# Patient Record
Sex: Male | Born: 1995
Health system: Southern US, Community
[De-identification: ages and names within clinical notes are randomized; demographics above are authoritative.]

## PROBLEM LIST (undated history)

## (undated) DIAGNOSIS — H509 Unspecified strabismus: Secondary | ICD-10-CM

## (undated) DIAGNOSIS — S43005D Unspecified dislocation of left shoulder joint, subsequent encounter: Secondary | ICD-10-CM

## (undated) DIAGNOSIS — F909 Attention-deficit hyperactivity disorder, unspecified type: Secondary | ICD-10-CM

## (undated) DIAGNOSIS — F952 Tourette's disorder: Secondary | ICD-10-CM

## (undated) DIAGNOSIS — K219 Gastro-esophageal reflux disease without esophagitis: Secondary | ICD-10-CM

## (undated) HISTORY — DX: Unspecified dislocation of left shoulder joint, subsequent encounter: S43.005D

## (undated) HISTORY — DX: Gastro-esophageal reflux disease without esophagitis: K21.9

## (undated) HISTORY — PX: EYE SURGERY: SHX253

## (undated) HISTORY — DX: Tourette's disorder: F95.2

## (undated) HISTORY — DX: Attention-deficit hyperactivity disorder, unspecified type: F90.9

## (undated) HISTORY — DX: Unspecified strabismus: H50.9

---
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
## 2014-01-01 LAB — RPR

## 2014-01-01 LAB — GC/CHLAMYDIA PROBE AMP, URINE
Chlamydia, Swab/Urine, PCR: NEGATIVE
GC Probe Amp, Urine: NEGATIVE

## 2014-01-01 LAB — HSV(HERPES SIMPLEX VRS) I + II AB-IGG
HSV 1 Glycoprotein G Ab, IgG: <0.1 IV
HSV 2 Glycoprotein G Ab, IgG: <0.1 IV

## 2014-01-01 LAB — HEPATITIS B SURFACE ANTIGEN: Hepatitis B Surface Ag: NEGATIVE

## 2014-01-01 LAB — HIV ANTIBODY (ROUTINE TESTING W REFLEX): HIV 1&2 Ab, 4th Generation: NONREACTIVE

## 2014-01-01 LAB — HEPATITIS C ANTIBODY: HCV Ab: NEGATIVE

## 2015-01-20 ENCOUNTER — Ambulatory Visit (INDEPENDENT_AMBULATORY_CARE_PROVIDER_SITE_OTHER): Payer: BLUE CROSS/BLUE SHIELD | Admitting: Physician Assistant

## 2015-01-20 VITALS — BP 125/82 | HR 64 | Temp 98.6°F | Resp 18 | Wt 233.2 lb

## 2015-01-20 DIAGNOSIS — Z23 Encounter for immunization: Secondary | ICD-10-CM | POA: Diagnosis not present

## 2015-01-20 DIAGNOSIS — S60011A Contusion of right thumb without damage to nail, initial encounter: Secondary | ICD-10-CM

## 2015-01-20 DIAGNOSIS — S6010XA Contusion of unspecified finger with damage to nail, initial encounter: Secondary | ICD-10-CM

## 2015-01-20 NOTE — Patient Instructions (Signed)
Wash daily with soap and water.

## 2015-01-20 NOTE — Progress Notes (Signed)
Subjective:   Patient ID: Caleb Kent, male     DOB: 01-03-1996, 19 y.o.    MRN: [id number]  PCP: Nelwyn Salisbury, MD  Chief Complaint  Patient presents with  . Hand Pain    Lt Thumb smashed in car door  x over 1 month ago    HPI  Presents for evaluation of LEFT thumb injury. He is accompanied by his mother (this is his aunt by marriage).  One month ago, the LEFT thumb was smashed in a car door. His mother urged him to get it evaluated, but he declined. Now the back edge of the nail is lifting up. The thumb is not tender, but he has pain when he tries to pull the nail off. No fever or chills. Tdap 2008.  Prior to Admission medications   Medication Sig Start Date End Date Taking? Authorizing Provider  albuterol (PROVENTIL HFA;VENTOLIN HFA) 108 (90 BASE) MCG/ACT inhaler Inhale 2 puffs into the lungs every 6 (six) hours as needed for wheezing.   Yes Historical Provider, MD  albuterol (PROVENTIL) (2.5 MG/3ML) 0.083% nebulizer solution USE 1 VIAL PER NEBULIZER EVERY 4 HOURS AS NEEDED FOR WHEEZING OR SHORTNESS OF BREATH   Yes Nelwyn Salisbury, MD  PROAIR HFA 108 (90 BASE) MCG/ACT inhaler INHALE 2 PUFFS INTO THE LUNGS EVERY 4 (FOUR) HOURS AS NEEDED FOR WHEEZING OR SHORTNESS OF BREATH.   Yes Nelwyn Salisbury, MD  LORazepam (ATIVAN) 1 MG tablet Take 1 mg by mouth every 6 (six) hours as needed for anxiety.    Historical Provider, MD     No Known Allergies   Patient Active Problem List   Diagnosis Date Noted  . VIRAL URI 07/14/2009  . ABDOMINAL PAIN, EPIGASTRIC 07/07/2009  . KNEE SPRAIN 05/25/2009  . TOURETTE'S DISORDER 03/05/2009  . VIRAL GASTROENTERITIS 04/23/2008  . ADHD 01/08/2008  . ALLERGIC RHINITIS 05/15/2007  . ASTHMA 12/26/2006     History reviewed. No pertinent family history.   Social History   Social History  . Marital Status: Single    Spouse Name: n/a  . Number of Children: 0  . Years of Education: 13   Occupational History  . catering     part-time     Social History Main Topics  . Smoking status: Current Every Day Smoker    Types: Cigarettes  . Smokeless tobacco: Never Used  . Alcohol Use: No  . Drug Use: No  . Sexual Activity: Not on file   Other Topics Concern  . Not on file   Social History Narrative   Lives with paternal aunt and uncle.   Negative history of passive tobacco smoke exposure   Care taker verifies today that the child's immunizations are up to date.         Review of Systems  Constitutional: Negative for fever, chills, diaphoresis and fatigue.  Respiratory: Negative for shortness of breath.   Musculoskeletal: Negative for myalgias, joint swelling and arthralgias.  Skin: Positive for wound. Negative for rash.  Neurological: Negative for dizziness.         Objective:  Physical Exam  Constitutional: He is oriented to person, place, and time. He appears well-developed and well-nourished. He is active and cooperative. No distress.  BP 125/82 mmHg  Pulse 64  Temp(Src) 98.6 F (37 C) (Oral)  Resp 18  Wt 233 lb 3.2 oz (105.779 kg)  SpO2 98%   Eyes: Conjunctivae are normal.  Pulmonary/Chest: Effort normal.  Musculoskeletal:  Left hand: He exhibits tenderness (when lifting the nail). He exhibits normal range of motion, no bony tenderness, normal capillary refill and no swelling. Normal sensation noted. Normal strength noted.  95% subungual hematoma. The proximal edge of the nail has lifted above the nail fold, and new nail is visible underneath. There is dried, crusted blood under the nail, and attachment to the nail bed is minimal, at the distal edge.  Neurological: He is alert and oriented to person, place, and time.  Psychiatric: He has a normal mood and affect. His speech is normal and behavior is normal.    PROCEDURE: Verbal Consent Obtained. Digital Block with 5 cc 2% Lidocaine plain, and then 2 additional cc to achieve block. Sterile Prep. Nail lifted and excised, en toto. Cleansed  and dressed.           Assessment & Plan:  1. Subungual hematoma of digit of hand, initial encounter Local wound care. Anticipatory guidance provided.  2. Need for influenza vaccination - Flu Vaccine QUAD 36+ mos IM   Fernande Bras, PA-C Physician Assistant-Certified [facility]

## 2015-05-06 ENCOUNTER — Other Ambulatory Visit: Payer: Self-pay | Admitting: Family Medicine

## 2015-06-15 ENCOUNTER — Emergency Department (HOSPITAL_COMMUNITY): Payer: BLUE CROSS/BLUE SHIELD

## 2015-06-15 ENCOUNTER — Encounter (HOSPITAL_COMMUNITY): Payer: Self-pay

## 2015-06-15 ENCOUNTER — Emergency Department (HOSPITAL_COMMUNITY)
Admission: EM | Admit: 2015-06-15 | Discharge: 2015-06-15 | Disposition: A | Discharge status: Home / Self Care | Payer: BLUE CROSS/BLUE SHIELD | Attending: Emergency Medicine | Admitting: Emergency Medicine

## 2015-06-15 DIAGNOSIS — S4992XA Unspecified injury of left shoulder and upper arm, initial encounter: Secondary | ICD-10-CM | POA: Diagnosis present

## 2015-06-15 DIAGNOSIS — S43035A Inferior dislocation of left humerus, initial encounter: Secondary | ICD-10-CM | POA: Diagnosis not present

## 2015-06-15 DIAGNOSIS — X58XXXA Exposure to other specified factors, initial encounter: Secondary | ICD-10-CM | POA: Diagnosis not present

## 2015-06-15 DIAGNOSIS — Y92310 Basketball court as the place of occurrence of the external cause: Secondary | ICD-10-CM | POA: Insufficient documentation

## 2015-06-15 DIAGNOSIS — J45909 Unspecified asthma, uncomplicated: Secondary | ICD-10-CM | POA: Insufficient documentation

## 2015-06-15 DIAGNOSIS — Z8669 Personal history of other diseases of the nervous system and sense organs: Secondary | ICD-10-CM | POA: Insufficient documentation

## 2015-06-15 DIAGNOSIS — IMO0001 Reserved for inherently not codable concepts without codable children: Secondary | ICD-10-CM

## 2015-06-15 DIAGNOSIS — Z8659 Personal history of other mental and behavioral disorders: Secondary | ICD-10-CM | POA: Diagnosis not present

## 2015-06-15 DIAGNOSIS — Y9367 Activity, basketball: Secondary | ICD-10-CM | POA: Diagnosis not present

## 2015-06-15 DIAGNOSIS — S43015A Anterior dislocation of left humerus, initial encounter: Secondary | ICD-10-CM | POA: Diagnosis not present

## 2015-06-15 DIAGNOSIS — Y998 Other external cause status: Secondary | ICD-10-CM | POA: Insufficient documentation

## 2015-06-15 DIAGNOSIS — F17210 Nicotine dependence, cigarettes, uncomplicated: Secondary | ICD-10-CM | POA: Diagnosis not present

## 2015-06-15 DIAGNOSIS — Z79899 Other long term (current) drug therapy: Secondary | ICD-10-CM | POA: Insufficient documentation

## 2015-06-15 DIAGNOSIS — S43005A Unspecified dislocation of left shoulder joint, initial encounter: Secondary | ICD-10-CM

## 2015-06-15 DIAGNOSIS — Z8719 Personal history of other diseases of the digestive system: Secondary | ICD-10-CM | POA: Insufficient documentation

## 2015-06-15 MED ORDER — HYDROMORPHONE HCL 1 MG/ML IJ SOLN
1.0000 mg | Freq: Once | INTRAMUSCULAR | Status: AC
  Administered 2015-06-15: 1 mg via INTRAVENOUS
  Filled 2015-06-15: qty 1

## 2015-06-15 MED ORDER — PROPOFOL 10 MG/ML IV BOLUS
INTRAVENOUS | Status: AC | PRN
  Administered 2015-06-15: 50 mg via INTRAVENOUS

## 2015-06-15 MED ORDER — HYDROCODONE-ACETAMINOPHEN 5-325 MG PO TABS: 1.0000 | ORAL_TABLET | Freq: Four times a day (QID) | ORAL | Status: DC | PRN

## 2015-06-15 MED ORDER — PROPOFOL 10 MG/ML IV BOLUS
200.0000 mg | Freq: Once | INTRAVENOUS | Status: AC
  Administered 2015-06-15: 50 mg via INTRAVENOUS
  Filled 2015-06-15: qty 20

## 2015-06-15 NOTE — ED Provider Notes (Signed)
CSN: [id number]     Arrival date & time 06/15/15  1246 History   First MD Initiated Contact with Patient 06/15/15 1315     Chief Complaint  Patient presents with  . Arm Pain     (Consider location/radiation/quality/duration/timing/severity/associated sxs/prior Treatment) Patient is a 20 y.o. male presenting with shoulder pain.  Shoulder Pain Location:  Shoulder Injury: no   Shoulder location:  L shoulder Pain details:    Quality:  Aching and sharp   Timing:  Constant   Progression:  Unchanged Handedness:  Right-handed Dislocation: yes   Foreign body present:  No foreign bodies Tetanus status:  Up to date Prior injury to area:  No Relieved by:  None tried Worsened by:  Movement Ineffective treatments:  None tried Associated symptoms: no back pain, no fever, no stiffness and no swelling     Past Medical History  Diagnosis Date  . Asthma   . ADHD (attention deficit hyperactivity disorder)   . Strabismus     sees Dr. Maple Hudson  . Tourette syndrome     sees Sr. Hickling  . GERD (gastroesophageal reflux disease)    Past Surgical History  Procedure Laterality Date  . Eye surgery Right    History reviewed. No pertinent family history. Social History  Substance Use Topics  . Smoking status: Current Every Day Smoker    Types: Cigarettes  . Smokeless tobacco: Never Used  . Alcohol Use: No    Review of Systems  Constitutional: Negative for fever and chills.  Eyes: Negative for pain.  Respiratory: Negative for cough and shortness of breath.   Musculoskeletal: Negative for back pain and stiffness.       Left shoulder pain  All other systems reviewed and are negative.     Allergies  Review of patient's allergies indicates no known allergies.  Home Medications   Prior to Admission medications   Medication Sig Start Date End Date Taking? Authorizing Provider  albuterol (PROVENTIL HFA;VENTOLIN HFA) 108 (90 BASE) MCG/ACT inhaler Inhale 2 puffs into the lungs every 6  (six) hours as needed for wheezing.   Yes Historical Provider, MD  albuterol (PROVENTIL) (2.5 MG/3ML) 0.083% nebulizer solution USE 1 VIAL PER NEBULIZER EVERY 4 HOURS AS NEEDED FOR WHEEZING OR SHORTNESS OF BREATH 05/06/15  Yes Nelwyn Salisbury, MD   BP 135/87 mmHg  Pulse 71  Temp(Src) 98 F (36.7 C) (Oral)  Resp 16  SpO2 100% Physical Exam  Constitutional: He appears well-developed and well-nourished.  HENT:  Head: Normocephalic and atraumatic.  Eyes: Conjunctivae are normal. Pupils are equal, round, and reactive to light.  Neck: Normal range of motion.  Cardiovascular: Normal rate.   Pulmonary/Chest: Effort normal. No respiratory distress. He has no rales.  Abdominal: Soft. He exhibits no distension. There is no tenderness. There is no rebound.  Musculoskeletal: Normal range of motion. He exhibits tenderness (left shoulder wth deformity).  Neurological: He is alert.  Skin: Skin is warm and dry.  Nursing note and vitals reviewed.   ED Course  .Sedation Date/Time: 06/15/2015 2:53 PM Performed by: Marily Memos Authorized by: Marily Memos  Consent:    Consent obtained:  Verbal and written   Consent given by:  Patient   Risks discussed:  Allergic reaction, dysrhythmia, inadequate sedation, nausea, respiratory compromise necessitating ventilatory assistance and intubation and prolonged hypoxia resulting in organ damage   Alternatives discussed:  Analgesia without sedation and anxiolysis Indications:    Sedation purpose:  Dislocation reduction   Procedure necessitating sedation performed by:  Physician performing sedation   Intended level of sedation:  Moderate (conscious sedation) Pre-sedation assessment:    Time since last food or drink:  2 hours   ASA classification: class 2 - patient with mild systemic disease     Neck mobility: normal     Mouth opening:  3 or more finger widths   Thyromental distance:  3 finger widths   Mallampati score:  II - soft palate, uvula, fauces  visible   Pre-sedation assessments completed and reviewed: airway patency, cardiovascular function, hydration status, mental status, nausea/vomiting and respiratory function     Pre-sedation assessment completed:  06/15/2015 2:54 PM Immediate pre-procedure details:    Reassessment: Patient reassessed immediately prior to procedure     Reviewed: vital signs, relevant labs/tests and NPO status     Verified: bag valve mask available, emergency equipment available, intubation equipment available, IV patency confirmed and oxygen available   Procedure details (see MAR for exact dosages):    Sedation start time:  06/15/2015 2:54 PM   Preoxygenation:  Nasal cannula   Sedation:  Propofol   Analgesia:  Hydromorphone   Intra-procedure monitoring:  Blood pressure monitoring, cardiac monitor, continuous capnometry and continuous pulse oximetry   Intra-procedure events: none     Sedation end time:  06/15/2015 3:48 PM   Total sedation time (minutes):  35 Post-procedure details:    Post-sedation assessment completed:  06/15/2015 3:48 PM   Attendance: Constant attendance by certified staff until patient recovered     Recovery: Patient returned to pre-procedure baseline     Post-sedation assessments completed and reviewed: airway patency, cardiovascular function and hydration status     Patient is stable for discharge or admission: Yes     Patient tolerance:  Tolerated well, no immediate complications Reduction of dislocation Date/Time: 06/15/2015 2:50 PM Performed by: Marily MemosMESNER, Donatello Kleve Authorized by: Marily MemosMESNER, Marney Treloar Consent: Verbal consent obtained. Written consent obtained. Risks and benefits: risks, benefits and alternatives were discussed Patient understanding: patient states understanding of the procedure being performed Patient consent: the patient's understanding of the procedure does not match consent given Procedure consent: procedure consent does not match procedure scheduled Required items: required  blood products, implants, devices, and special equipment available Patient identity confirmed: verbally with patient Time out: Immediately prior to procedure a "time out" was called to verify the correct patient, procedure, equipment, support staff and site/side marked as required. Local anesthesia used: no Patient sedated: yes Sedation type: moderate (conscious) sedation Sedatives: propofol Sedation end date/time: 06/15/2015 3:49 PM Vitals: Vital signs were monitored during sedation. Patient tolerance: Patient tolerated the procedure well with no immediate complications Comments: Sedation per note.  Once fully sedated,    (including critical care time) Labs Review Labs Reviewed - No data to display  Imaging Review Dg Shoulder Left  06/15/2015  CLINICAL DATA:  Initial encounter for Left shoulder injury while playing basketball earlier today - hx of several dislocations, pt states he can usually reduce it himself EXAM: LEFT SHOULDER - 2+ VIEW COMPARISON:  None. FINDINGS: Anterior inferior dislocation of the left glenohumeral joint. The second image is suboptimal secondary to patient size. Therefore, decreased sensitivity for for Hill-Sachs fracture. Left hemithorax grossly unremarkable. IMPRESSION: Anterior inferior dislocation of the left glenohumeral joint. Electronically Signed   By: Jeronimo GreavesKyle  Talbot M.D.   On: 06/15/2015 13:51   Dg Shoulder Left Port  06/15/2015  CLINICAL DATA:  Relocation. EXAM: LEFT SHOULDER - 1 VIEW COMPARISON:  Earlier today at 1340 hours. FINDINGS: 1558 hours. Interval relocation of  the left glenohumeral joint. No Hill-Sachs fracture identified. IMPRESSION: Interval relocation of the left glenohumeral joint. Electronically Signed   By: Jeronimo Greaves M.D.   On: 06/15/2015 16:13   I have personally reviewed and evaluated these images and lab results as part of my medical decision-making.   EKG Interpretation None      MDM   Final diagnoses:  Dislocation     20 year old male with a left shoulder dislocation that I reduced under sedation as documented in procedure note above. Neurovascularly intact before and after the procedure. Repeat x-ray does not show a fracture and it he worse appears to be relocated. Patient's had multiple dislocations before that spontaneously reduced so think he needs close orthopedic follow-up for any possible interventions.  I have personally and contemperaneously reviewed labs and imaging and used in my decision making as above.   A medical screening exam was performed and I feel the patient has had an appropriate workup for their chief complaint at this time and likelihood of emergent condition existing is low. Their vital signs are stable. They have been counseled on decision, discharge, follow up and which symptoms necessitate immediate return to the emergency department.  They verbally stated understanding and agreement with plan and discharged in stable condition.    Marily Memos, MD 06/15/15 1622

## 2015-06-15 NOTE — ED Notes (Signed)
Pt has hx of dislocated shoulder.  Pt here with arm pain.  Shoulder appears dropped.  Pt denies injury but was rotating arm and felt pop.l

## 2015-06-16 ENCOUNTER — Emergency Department (HOSPITAL_COMMUNITY): Payer: BLUE CROSS/BLUE SHIELD

## 2015-06-16 ENCOUNTER — Encounter (HOSPITAL_COMMUNITY): Payer: Self-pay | Admitting: Emergency Medicine

## 2015-06-16 ENCOUNTER — Emergency Department (HOSPITAL_COMMUNITY)
Admission: EM | Admit: 2015-06-16 | Discharge: 2015-06-16 | Disposition: A | Discharge status: Home / Self Care | Payer: BLUE CROSS/BLUE SHIELD | Attending: Physician Assistant | Admitting: Physician Assistant

## 2015-06-16 DIAGNOSIS — Y998 Other external cause status: Secondary | ICD-10-CM | POA: Diagnosis not present

## 2015-06-16 DIAGNOSIS — S43015A Anterior dislocation of left humerus, initial encounter: Secondary | ICD-10-CM | POA: Insufficient documentation

## 2015-06-16 DIAGNOSIS — X58XXXA Exposure to other specified factors, initial encounter: Secondary | ICD-10-CM | POA: Diagnosis not present

## 2015-06-16 DIAGNOSIS — Z8659 Personal history of other mental and behavioral disorders: Secondary | ICD-10-CM | POA: Insufficient documentation

## 2015-06-16 DIAGNOSIS — Z79899 Other long term (current) drug therapy: Secondary | ICD-10-CM | POA: Insufficient documentation

## 2015-06-16 DIAGNOSIS — Z8669 Personal history of other diseases of the nervous system and sense organs: Secondary | ICD-10-CM | POA: Diagnosis not present

## 2015-06-16 DIAGNOSIS — J45909 Unspecified asthma, uncomplicated: Secondary | ICD-10-CM | POA: Insufficient documentation

## 2015-06-16 DIAGNOSIS — S4992XA Unspecified injury of left shoulder and upper arm, initial encounter: Secondary | ICD-10-CM | POA: Diagnosis present

## 2015-06-16 DIAGNOSIS — F17210 Nicotine dependence, cigarettes, uncomplicated: Secondary | ICD-10-CM | POA: Diagnosis not present

## 2015-06-16 DIAGNOSIS — M25519 Pain in unspecified shoulder: Secondary | ICD-10-CM

## 2015-06-16 DIAGNOSIS — Y9367 Activity, basketball: Secondary | ICD-10-CM | POA: Diagnosis not present

## 2015-06-16 DIAGNOSIS — Z8719 Personal history of other diseases of the digestive system: Secondary | ICD-10-CM | POA: Diagnosis not present

## 2015-06-16 DIAGNOSIS — Y92310 Basketball court as the place of occurrence of the external cause: Secondary | ICD-10-CM | POA: Diagnosis not present

## 2015-06-16 DIAGNOSIS — S43005A Unspecified dislocation of left shoulder joint, initial encounter: Secondary | ICD-10-CM

## 2015-06-16 MED ORDER — PROPOFOL 10 MG/ML IV BOLUS
200.0000 mg | Freq: Once | INTRAVENOUS | Status: DC
  Filled 2015-06-16: qty 20

## 2015-06-16 MED ORDER — PROPOFOL 10 MG/ML IV BOLUS
INTRAVENOUS | Status: AC | PRN
  Administered 2015-06-16 (×3): 52.5 mg via INTRAVENOUS

## 2015-06-16 MED ORDER — HYDROMORPHONE HCL 1 MG/ML IJ SOLN
1.0000 mg | Freq: Once | INTRAMUSCULAR | Status: AC
  Administered 2015-06-16: 1 mg via INTRAVENOUS
  Filled 2015-06-16: qty 1

## 2015-06-16 NOTE — Discharge Instructions (Signed)
Shoulder Dislocation Your shoulder joint is made up of 3 bones:  The upper arm bone (humerus).  The shoulder blade (scapula).  The collarbone (clavicle). A shoulder dislocation happens when your upper arm bone moves out of its normal place in your shoulder joint. HOME CARE If You Have a Splint or Sling:  Wear it as told by your doctor.  Take it off only as told by your doctor.  Loosen it if:  Your fingers become numb and tingly.  Your fingers turn cold and blue.  Keep it clean and dry. Bathing  Do not take baths, swim, or use a hot tub until your doctor says you can. Ask your doctor if you can take showers. You may only be allowed to take sponge baths.  If your doctor says taking baths or showers is okay, cover your splint or sling with a plastic bag. Do not let the splint or sling get wet. Managing Pain, Stiffness, and Swelling  If told, put ice on the injured area.  Put ice in a plastic bag.  Place a towel between your skin and the bag.  Leave the ice on for 20 minutes, 2-3 times per day.  Move your fingers often to avoid stiffness and to lessen swelling.  Raise (elevate) the injured area above the level of your heart while you are sitting or lying down. Driving  Do not drive while you are wearing a splint or sling on a hand that you use for driving.  Do not drive or operate heavy machinery while taking pain medicine. Activity  Return to your normal activities as told by your doctor. Ask your doctor what activities are safe for you.  Do range-of-motion exercises only as told by your doctor.  Exercise your hand by squeezing a soft ball. This keeps your hand and wrist from getting stiff and swollen. General Instructions  Take over-the-counter and prescription medicines only as told by your doctor.  Do not use any tobacco products, including cigarettes, chewing tobacco, or e-cigarettes. Tobacco can slow down healing. If you need help quitting, ask your  doctor.  Keep all follow-up visits as told by your doctor. This is important. GET HELP IF:  Your splint or sling gets damaged. GET HELP RIGHT AWAY IF:  Your pain gets worse instead of better.  You lose feeling in your arm or hand.  Your arm or hand turns white and cold.   This information is not intended to replace advice given to you by your health care provider. Make sure you discuss any questions you have with your health care provider.   Follow-up with orthopedic provider. Avoid raising her arm overhead. Sleep in a upright position. Take home pain medication as needed. Apply ice to affected area.

## 2015-06-16 NOTE — ED Notes (Signed)
Per pt, states he was here yesterday for the same symptoms-was put back on place-states he he slept on it wrong and thinks it is dislocated again

## 2015-06-16 NOTE — ED Notes (Signed)
Pt's previous shoulder sling and swath applied by ortho tech with home teaching for pt and mother

## 2015-06-17 NOTE — ED Provider Notes (Signed)
CSN: [id number]     Arrival date & time 06/16/15  1031 History   First MD Initiated Contact with Patient 06/16/15 1152     Chief Complaint  Patient presents with  . Shoulder Pain     (Consider location/radiation/quality/duration/timing/severity/associated sxs/prior Treatment) HPI   Caleb Kent is a 20 y.o M who presents to the ED today c/o shoulder pain. Pt was seen in the ED yesterday with a shoulder dislocation after playing basketball. Pt was consciously sedated and had successful shoulder reduction. Pt states that he took a shower last night and raised his arm over his head. He also slept on his side instead of in the chair like he was instructed to. Today when pt woke up he noticed that he shoulder had popped out of place again. Pt has appointment with orthopedic doctor later today. Denies numbness or weakness.   Past Medical History  Diagnosis Date  . Asthma   . ADHD (attention deficit hyperactivity disorder)   . Strabismus     sees Dr. Maple Hudson  . Tourette syndrome     sees Sr. Hickling  . GERD (gastroesophageal reflux disease)    Past Surgical History  Procedure Laterality Date  . Eye surgery Right    No family history on file. Social History  Substance Use Topics  . Smoking status: Current Every Day Smoker    Types: Cigarettes  . Smokeless tobacco: Never Used  . Alcohol Use: No    Review of Systems  All other systems reviewed and are negative.     Allergies  Review of patient's allergies indicates no known allergies.  Home Medications   Prior to Admission medications   Medication Sig Start Date End Date Taking? Authorizing Provider  albuterol (PROVENTIL HFA;VENTOLIN HFA) 108 (90 BASE) MCG/ACT inhaler Inhale 2 puffs into the lungs every 6 (six) hours as needed for wheezing.   Yes Historical Provider, MD  albuterol (PROVENTIL) (2.5 MG/3ML) 0.083% nebulizer solution USE 1 VIAL PER NEBULIZER EVERY 4 HOURS AS NEEDED FOR WHEEZING OR SHORTNESS OF BREATH  05/06/15  Yes Nelwyn Salisbury, MD  HYDROcodone-acetaminophen (NORCO) 5-325 MG tablet Take 1 tablet by mouth every 6 (six) hours as needed for severe pain. 06/15/15  Yes Caleb Mesner, MD   BP 129/72 mmHg  Pulse 65  Temp(Src) 98.8 F (37.1 C) (Oral)  Resp 22  SpO2 100% Physical Exam  Constitutional: He is oriented to person, place, and time. He appears well-developed and well-nourished. No distress.  HENT:  Head: Normocephalic and atraumatic.  Eyes: Conjunctivae are normal. Right eye exhibits no discharge. Left eye exhibits no discharge. No scleral icterus.  Cardiovascular: Normal rate.   Pulmonary/Chest: Effort normal.  Musculoskeletal:  Obvious deformity of left shoulder. Depression of glenohumeral joint. Intact distal pulses.   Neurological: He is alert and oriented to person, place, and time. Coordination normal.  Skin: Skin is warm and dry. No rash noted. He is not diaphoretic. No erythema. No pallor.  Psychiatric: He has a normal mood and affect. His behavior is normal.  Nursing note and vitals reviewed.   ED Course  Procedures (including critical care time) Reduction of dislocation Date/Time: 06/16/15 Performed by: Dub Mikes Authorized by: Dub Mikes Consent: Verbal consent obtained. Risks and benefits: risks, benefits and alternatives were discussed Consent given by: patient Required items: required blood products, implants, devices, and special equipment available Time out: Immediately prior to procedure a "time out" was called to verify the correct patient, procedure, equipment, support staff and  site/side marked as required.  Patient sedated: yes  Vitals: Vital signs were monitored during sedation. Patient tolerance: Patient tolerated the procedure well with no immediate complications. Joint: left shoulder Reduction technique: downward pressure, external rotation    Labs Review Labs Reviewed - No data to display  Imaging Review Dg Shoulder  Left  06/16/2015  CLINICAL DATA:  Status post left shoulder dislocation 06/15/2015 with subsequent reduction. Left shoulder pain again today. Question dislocation. Initial encounter. EXAM: LEFT SHOULDER - 2+ VIEW COMPARISON:  Plain films left shoulder 06/15/2015. FINDINGS: The left humerus is anteriorly dislocated. No acute fracture is identified. Winged appearance of the scapula is likely positional. IMPRESSION: Anterior left shoulder dislocation. Electronically Signed   By: Drusilla Kannerhomas  Dalessio M.D.   On: 06/16/2015 11:31   Dg Shoulder Left Port  06/16/2015  CLINICAL DATA:  Post reduction EXAM: LEFT SHOULDER - 1 VIEW COMPARISON:  Portable exam 1413 hours compared to earlier study of 06/16/2015 FINDINGS: Previously identified anterior LEFT glenohumeral dislocation appears reduced. AC joint alignment normal. Osseous mineralization normal. No definite fractures identified. IMPRESSION: Reduction of previously identified LEFT glenohumeral dislocation. Electronically Signed   By: Ulyses SouthwardMark  Boles M.D.   On: 06/16/2015 14:24   I have personally reviewed and evaluated these images and lab results as part of my medical decision-making.   EKG Interpretation None      MDM   Final diagnoses:  Shoulder pain  Shoulder dislocation, left, initial encounter    20 year old male with left shoulder dislocation, reduced under sedation as documented in Dr.MacKuen's notes. Patient is neurovascularly intact before and after procedure. Repeat x-ray does not show fracture and appears to be relocated. Patient has follow-up appointment with orthopedic provider for later today. Patient tolerated sedation well. Strict return precautions given.  Patient was discussed with and seen by Dr. Corlis LeakMacKuen who agrees with the treatment plan.      Lester KinsmanSamantha Tripp [place], PA-C 06/17/15 2117  Courteney Randall AnLyn Mackuen, MD 06/18/15 1015

## 2015-06-23 ENCOUNTER — Encounter (HOSPITAL_BASED_OUTPATIENT_CLINIC_OR_DEPARTMENT_OTHER): Payer: Self-pay | Admitting: *Deleted

## 2015-07-02 ENCOUNTER — Encounter (HOSPITAL_BASED_OUTPATIENT_CLINIC_OR_DEPARTMENT_OTHER)
Admission: RE | Disposition: A | Discharge status: Home / Self Care | Payer: Self-pay | Source: Ambulatory Visit | Attending: Orthopedic Surgery

## 2015-07-02 ENCOUNTER — Encounter (HOSPITAL_BASED_OUTPATIENT_CLINIC_OR_DEPARTMENT_OTHER): Payer: Self-pay | Admitting: Certified Registered"

## 2015-07-02 ENCOUNTER — Ambulatory Visit (HOSPITAL_BASED_OUTPATIENT_CLINIC_OR_DEPARTMENT_OTHER): Payer: BLUE CROSS/BLUE SHIELD | Admitting: Certified Registered"

## 2015-07-02 ENCOUNTER — Ambulatory Visit (HOSPITAL_BASED_OUTPATIENT_CLINIC_OR_DEPARTMENT_OTHER)
Admission: RE | Admit: 2015-07-02 | Discharge: 2015-07-02 | Disposition: A | Discharge status: Home / Self Care | Payer: BLUE CROSS/BLUE SHIELD | Source: Ambulatory Visit | Attending: Orthopedic Surgery | Admitting: Orthopedic Surgery

## 2015-07-02 DIAGNOSIS — M24112 Other articular cartilage disorders, left shoulder: Secondary | ICD-10-CM | POA: Insufficient documentation

## 2015-07-02 DIAGNOSIS — J45909 Unspecified asthma, uncomplicated: Secondary | ICD-10-CM | POA: Diagnosis not present

## 2015-07-02 DIAGNOSIS — F17200 Nicotine dependence, unspecified, uncomplicated: Secondary | ICD-10-CM | POA: Diagnosis not present

## 2015-07-02 DIAGNOSIS — X58XXXA Exposure to other specified factors, initial encounter: Secondary | ICD-10-CM | POA: Diagnosis not present

## 2015-07-02 DIAGNOSIS — S5292XA Unspecified fracture of left forearm, initial encounter for closed fracture: Secondary | ICD-10-CM | POA: Diagnosis present

## 2015-07-02 DIAGNOSIS — S43439A Superior glenoid labrum lesion of unspecified shoulder, initial encounter: Secondary | ICD-10-CM | POA: Diagnosis present

## 2015-07-02 DIAGNOSIS — S42292A Other displaced fracture of upper end of left humerus, initial encounter for closed fracture: Secondary | ICD-10-CM | POA: Diagnosis not present

## 2015-07-02 HISTORY — PX: SHOULDER ARTHROSCOPY WITH BANKART REPAIR: SHX5673

## 2015-07-02 SURGERY — SHOULDER ARTHROSCOPY WITH BANKART REPAIR
Anesthesia: General | Site: Shoulder | Laterality: Left

## 2015-07-02 MED ORDER — FENTANYL CITRATE (PF) 100 MCG/2ML IJ SOLN
INTRAMUSCULAR | Status: AC
  Filled 2015-07-02: qty 2

## 2015-07-02 MED ORDER — SUCCINYLCHOLINE CHLORIDE 20 MG/ML IJ SOLN
INTRAMUSCULAR | Status: DC | PRN
  Administered 2015-07-02: 100 mg via INTRAVENOUS

## 2015-07-02 MED ORDER — ONDANSETRON 4 MG PO TBDP: 4.0000 mg | ORAL_TABLET | Freq: Three times a day (TID) | ORAL | Status: DC | PRN

## 2015-07-02 MED ORDER — SUCCINYLCHOLINE CHLORIDE 20 MG/ML IJ SOLN
INTRAMUSCULAR | Status: AC
  Filled 2015-07-02: qty 1

## 2015-07-02 MED ORDER — FENTANYL CITRATE (PF) 100 MCG/2ML IJ SOLN: 25.0000 ug | INTRAMUSCULAR | Status: DC | PRN

## 2015-07-02 MED ORDER — MIDAZOLAM HCL 2 MG/2ML IJ SOLN
INTRAMUSCULAR | Status: AC
  Filled 2015-07-02: qty 2

## 2015-07-02 MED ORDER — GLYCOPYRROLATE 0.2 MG/ML IJ SOLN: 0.2000 mg | Freq: Once | INTRAMUSCULAR | Status: DC | PRN

## 2015-07-02 MED ORDER — BUPIVACAINE-EPINEPHRINE (PF) 0.5% -1:200000 IJ SOLN
INTRAMUSCULAR | Status: DC | PRN
  Administered 2015-07-02: 30 mL via PERINEURAL

## 2015-07-02 MED ORDER — LACTATED RINGERS IV SOLN: INTRAVENOUS | Status: DC

## 2015-07-02 MED ORDER — DEXAMETHASONE SODIUM PHOSPHATE 10 MG/ML IJ SOLN
INTRAMUSCULAR | Status: AC
  Filled 2015-07-02: qty 1

## 2015-07-02 MED ORDER — PHENYLEPHRINE 40 MCG/ML (10ML) SYRINGE FOR IV PUSH (FOR BLOOD PRESSURE SUPPORT)
PREFILLED_SYRINGE | INTRAVENOUS | Status: AC
  Filled 2015-07-02: qty 10

## 2015-07-02 MED ORDER — ONDANSETRON HCL 4 MG/2ML IJ SOLN
INTRAMUSCULAR | Status: DC | PRN
  Administered 2015-07-02: 4 mg via INTRAVENOUS

## 2015-07-02 MED ORDER — FENTANYL CITRATE (PF) 100 MCG/2ML IJ SOLN
50.0000 ug | INTRAMUSCULAR | Status: DC | PRN
  Administered 2015-07-02: 50 ug via INTRAVENOUS
  Administered 2015-07-02: 100 ug via INTRAVENOUS

## 2015-07-02 MED ORDER — CEFAZOLIN SODIUM-DEXTROSE 2-4 GM/100ML-% IV SOLN
INTRAVENOUS | Status: AC
  Filled 2015-07-02: qty 100

## 2015-07-02 MED ORDER — LACTATED RINGERS IV SOLN
INTRAVENOUS | Status: DC
  Administered 2015-07-02 (×2): via INTRAVENOUS

## 2015-07-02 MED ORDER — SCOPOLAMINE 1 MG/3DAYS TD PT72: 1.0000 | MEDICATED_PATCH | Freq: Once | TRANSDERMAL | Status: DC | PRN

## 2015-07-02 MED ORDER — ACETAMINOPHEN 500 MG PO TABS
1000.0000 mg | ORAL_TABLET | Freq: Once | ORAL | Status: AC
  Administered 2015-07-02: 1000 mg via ORAL

## 2015-07-02 MED ORDER — LIDOCAINE HCL (CARDIAC) 20 MG/ML IV SOLN
INTRAVENOUS | Status: AC
  Filled 2015-07-02: qty 5

## 2015-07-02 MED ORDER — ONDANSETRON HCL 4 MG/2ML IJ SOLN
INTRAMUSCULAR | Status: AC
  Filled 2015-07-02: qty 2

## 2015-07-02 MED ORDER — LIDOCAINE HCL (CARDIAC) 20 MG/ML IV SOLN
INTRAVENOUS | Status: DC | PRN
  Administered 2015-07-02: 30 mg via INTRAVENOUS

## 2015-07-02 MED ORDER — DEXAMETHASONE SODIUM PHOSPHATE 4 MG/ML IJ SOLN
INTRAMUSCULAR | Status: DC | PRN
  Administered 2015-07-02: 10 mg via INTRAVENOUS

## 2015-07-02 MED ORDER — ACETAMINOPHEN 500 MG PO TABS
ORAL_TABLET | ORAL | Status: AC
  Filled 2015-07-02: qty 2

## 2015-07-02 MED ORDER — OXYCODONE-ACETAMINOPHEN 5-325 MG PO TABS: 1.0000 | ORAL_TABLET | ORAL | Status: DC | PRN

## 2015-07-02 MED ORDER — CEFAZOLIN SODIUM-DEXTROSE 2-4 GM/100ML-% IV SOLN
2.0000 g | INTRAVENOUS | Status: AC
  Administered 2015-07-02: 2 g via INTRAVENOUS

## 2015-07-02 MED ORDER — MIDAZOLAM HCL 2 MG/2ML IJ SOLN
1.0000 mg | INTRAMUSCULAR | Status: DC | PRN
  Administered 2015-07-02: 1 mg via INTRAVENOUS
  Administered 2015-07-02: 2 mg via INTRAVENOUS

## 2015-07-02 MED ORDER — DEXTROSE-NACL 5-0.45 % IV SOLN: 100.0000 mL/h | INTRAVENOUS | Status: DC

## 2015-07-02 MED ORDER — PROPOFOL 10 MG/ML IV BOLUS
INTRAVENOUS | Status: DC | PRN
  Administered 2015-07-02: 200 mg via INTRAVENOUS

## 2015-07-02 SURGICAL SUPPLY — 62 items
ANCHOR SUT BIOCOMP LK 2.9X12.5 (Anchor) ×2 IMPLANT
BLADE CUTTER GATOR 3.5 (BLADE) IMPLANT
BLADE CUTTER MENIS 5.5 (BLADE) IMPLANT
BLADE GREAT WHITE 4.2 (BLADE) ×2 IMPLANT
BLADE SURG 15 STRL LF DISP TIS (BLADE) IMPLANT
BLADE SURG 15 STRL SS (BLADE)
BUR OVAL 6.0 (BURR) IMPLANT
CANNULA 5.75X71 LONG (CANNULA) ×2 IMPLANT
CANNULA TWIST IN 8.25X7CM (CANNULA) ×2 IMPLANT
CANNULA TWIST IN 8.25X9CM (CANNULA) ×2 IMPLANT
CHLORAPREP W/TINT 26ML (MISCELLANEOUS) ×2 IMPLANT
CLSR STERI-STRIP ANTIMIC 1/2X4 (GAUZE/BANDAGES/DRESSINGS) IMPLANT
DECANTER SPIKE VIAL GLASS SM (MISCELLANEOUS) IMPLANT
DRAPE IMP U-DRAPE 54X76 (DRAPES) ×2 IMPLANT
DRAPE INCISE IOBAN 66X45 STRL (DRAPES) ×2 IMPLANT
DRAPE SHOULDER BEACH CHAIR (DRAPES) ×2 IMPLANT
DRAPE U-SHAPE 47X51 STRL (DRAPES) ×2 IMPLANT
DRSG EMULSION OIL 3X3 NADH (GAUZE/BANDAGES/DRESSINGS) ×2 IMPLANT
DRSG PAD ABDOMINAL 8X10 ST (GAUZE/BANDAGES/DRESSINGS) ×2 IMPLANT
GAUZE SPONGE 4X4 12PLY STRL (GAUZE/BANDAGES/DRESSINGS) ×2 IMPLANT
GAUZE SPONGE 4X4 16PLY XRAY LF (GAUZE/BANDAGES/DRESSINGS) IMPLANT
GLOVE BIO SURGEON STRL SZ7.5 (GLOVE) ×4 IMPLANT
GLOVE BIO SURGEON STRL SZ8 (GLOVE) ×2 IMPLANT
GLOVE BIOGEL PI IND STRL 8 (GLOVE) ×2 IMPLANT
GLOVE BIOGEL PI INDICATOR 8 (GLOVE) ×2
GOWN STRL REUS W/ TWL LRG LVL3 (GOWN DISPOSABLE) ×1 IMPLANT
GOWN STRL REUS W/ TWL XL LVL3 (GOWN DISPOSABLE) ×3 IMPLANT
GOWN STRL REUS W/TWL LRG LVL3 (GOWN DISPOSABLE) ×1
GOWN STRL REUS W/TWL XL LVL3 (GOWN DISPOSABLE) ×3
IMMOBILIZER SHOULDER FOAM XLGE (SOFTGOODS) IMPLANT
KIT PUSHLOCK 2.9 HIP (KITS) IMPLANT
KIT SHOULDER TRACTION (DRAPES) ×2 IMPLANT
LASSO 90 CVE QUICKPAS (DISPOSABLE) IMPLANT
MANIFOLD NEPTUNE II (INSTRUMENTS) ×2 IMPLANT
NS IRRIG 1000ML POUR BTL (IV SOLUTION) IMPLANT
PACK ARTHROSCOPY DSU (CUSTOM PROCEDURE TRAY) ×2 IMPLANT
PACK BASIN DAY SURGERY FS (CUSTOM PROCEDURE TRAY) ×2 IMPLANT
SET ARTHROSCOPY TUBING (MISCELLANEOUS) ×1
SET ARTHROSCOPY TUBING LN (MISCELLANEOUS) ×1 IMPLANT
SLEEVE SCD COMPRESS KNEE MED (MISCELLANEOUS) ×2 IMPLANT
SLING ARM FOAM STRAP LRG (SOFTGOODS) IMPLANT
SLING ARM IMMOBILIZER LRG (SOFTGOODS) IMPLANT
SLING ARM IMMOBILIZER MED (SOFTGOODS) IMPLANT
SLING ARM MED ADULT FOAM STRAP (SOFTGOODS) IMPLANT
SLING ARM XL FOAM STRAP (SOFTGOODS) IMPLANT
SPONGE LAP 4X18 X RAY DECT (DISPOSABLE) IMPLANT
SUCTION FRAZIER HANDLE 10FR (MISCELLANEOUS)
SUCTION TUBE FRAZIER 10FR DISP (MISCELLANEOUS) IMPLANT
SUT ETHILON 3 0 PS 1 (SUTURE) ×2 IMPLANT
SUT FIBERWIRE #2 38 T-5 BLUE (SUTURE)
SUT MNCRL AB 4-0 PS2 18 (SUTURE) IMPLANT
SUT VIC AB 0 CT1 27 (SUTURE)
SUT VIC AB 0 CT1 27XBRD ANBCTR (SUTURE) IMPLANT
SUTURE FIBERWR #2 38 T-5 BLUE (SUTURE) IMPLANT
SYR BULB 3OZ (MISCELLANEOUS) IMPLANT
TAPE LABRALWHITE 1.5X36 (TAPE) IMPLANT
TAPE SUT LABRALTAP WHT/BLK (SUTURE) IMPLANT
TOWEL OR 17X24 6PK STRL BLUE (TOWEL DISPOSABLE) ×2 IMPLANT
TUBE CONNECTING 20X1/4 (TUBING) IMPLANT
WAND STAR VAC 90 (SURGICAL WAND) IMPLANT
WATER STERILE IRR 1000ML POUR (IV SOLUTION) ×2 IMPLANT
YANKAUER SUCT BULB TIP NO VENT (SUCTIONS) IMPLANT

## 2015-07-02 NOTE — Anesthesia Preprocedure Evaluation (Signed)
Anesthesia Evaluation  Patient identified by MRN, date of birth, ID band Patient awake    Reviewed: Allergy & Precautions, H&P , NPO status , Patient's Chart, lab work & pertinent test results  Airway Mallampati: II  TM Distance: >3 FB Neck ROM: full    Dental no notable dental hx. (+) Dental Advisory Given, Teeth Intact   Pulmonary asthma , Current Smoker,    Pulmonary exam normal breath sounds clear to auscultation       Cardiovascular Exercise Tolerance: Good negative cardio ROS Normal cardiovascular exam Rhythm:regular Rate:Normal     Neuro/Psych Tourette syndrome negative psych ROS   GI/Hepatic negative GI ROS, Neg liver ROS,   Endo/Other  negative endocrine ROS  Renal/GU negative Renal ROS  negative genitourinary   Musculoskeletal   Abdominal   Peds  Hematology negative hematology ROS (+)   Anesthesia Other Findings   Reproductive/Obstetrics negative OB ROS                             Anesthesia Physical Anesthesia Plan  ASA: II  Anesthesia Plan: General   Post-op Pain Management: GA combined w/ Regional for post-op pain   Induction: Intravenous  Airway Management Planned: Oral ETT  Additional Equipment:   Intra-op Plan:   Post-operative Plan: Extubation in OR  Informed Consent: I have reviewed the patients History and Physical, chart, labs and discussed the procedure including the risks, benefits and alternatives for the proposed anesthesia with the patient or authorized representative who has indicated his/her understanding and acceptance.   Dental Advisory Given  Plan Discussed with: CRNA and Surgeon  Anesthesia Plan Comments:         Anesthesia Quick Evaluation

## 2015-07-02 NOTE — Discharge Instructions (Signed)
°  Post Anesthesia Home Care Instructions ° °Activity: °Get plenty of rest for the remainder of the day. A responsible adult should stay with you for 24 hours following the procedure.  °For the next 24 hours, DO NOT: °-Drive a car °-Operate machinery °-Drink alcoholic beverages °-Take any medication unless instructed by your physician °-Make any legal decisions or sign important papers. ° °Meals: °Start with liquid foods such as gelatin or soup. Progress to regular foods as tolerated. Avoid greasy, spicy, heavy foods. If nausea and/or vomiting occur, drink only clear liquids until the nausea and/or vomiting subsides. Call your physician if vomiting continues. ° °Special Instructions/Symptoms: °Your throat may feel dry or sore from the anesthesia or the breathing tube placed in your throat during surgery. If this causes discomfort, gargle with warm salt water. The discomfort should disappear within 24 hours. ° °If you had a scopolamine patch placed behind your ear for the management of post- operative nausea and/or vomiting: ° °1. The medication in the patch is effective for 72 hours, after which it should be removed.  Wrap patch in a tissue and discard in the trash. Wash hands thoroughly with soap and water. °2. You may remove the patch earlier than 72 hours if you experience unpleasant side effects which may include dry mouth, dizziness or visual disturbances. °3. Avoid touching the patch. Wash your hands with soap and water after contact with the patch. °  °Regional Anesthesia Blocks ° °1. Numbness or the inability to move the "blocked" extremity may last from 3-48 hours after placement. The length of time depends on the medication injected and your individual response to the medication. If the numbness is not going away after 48 hours, call your surgeon. ° °2. The extremity that is blocked will need to be protected until the numbness is gone and the  Strength has returned. Because you cannot feel it, you will need  to take extra care to avoid injury. Because it may be weak, you may have difficulty moving it or using it. You may not know what position it is in without looking at it while the block is in effect. ° °3. For blocks in the legs and feet, returning to weight bearing and walking needs to be done carefully. You will need to wait until the numbness is entirely gone and the strength has returned. You should be able to move your leg and foot normally before you try and bear weight or walk. You will need someone to be with you when you first try to ensure you do not fall and possibly risk injury. ° °4. Bruising and tenderness at the needle site are common side effects and will resolve in a few days. ° °5. Persistent numbness or new problems with movement should be communicated to the surgeon or the  Surgery Center ([phone])/ [facility] ([phone]). °

## 2015-07-02 NOTE — Progress Notes (Signed)
Assisted Dr Ewell  with left, ultrasound guided, interscalene  block. Side rails up, monitors on throughout procedure. See vital signs in flow sheet. Tolerated Procedure well. 

## 2015-07-02 NOTE — H&P (Signed)
[facility] [address]   [address] [place], [place] [address] [phone] A Division of [facility]  Loreta Ave, M.D.   Robert A. Thurston Hole, M.D.   Burnell Blanks, M.D.   Eulas Post, M.D.   Lunette Stands, M.D. Jewel Baize. Eulah Pont, M.D.  Buford Dresser, M.D.  Estell Harpin, M.D.    Melina Fiddler, M.D. Janalee Dane, PA- C  Mary L. Dub Mikes, PA-C  Kirstin A. Shepperson, PA-C  Josh Neal, PA-C  [place], [place]  RE: Caleb Kent, Caleb Kent   [id number]      DOB: 08-19-1995 PROGRESS NOTE: 06-16-15  REASON FOR VISIT: Caleb Kent is here today with his mother for evaluation of his left shoulder pain. He has had multiple shoulder dislocations. He dislocated his shoulder again yesterday playing basketball. He went to [facility] where his shoulder was reduced. This came back out again last night in his sleep. They had to go back to get this re-reduced this morning.  He states this has been going on for many years he just never told anyone. In the past he has been able to get the shoulder back in on his own but yesterday he was not able to.  He currently works in Therapist, occupational.  Medications: Albuterol. ALLERGIES:  NO KNOWN DRUG ALLERGIES  Past medical history: Negative for any significant health problems.  Family history: Negative for any significant health problems. Social  History: He denies the use of tobacco or alcohol. 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   EXAMINATION: Examination of the left shoulder is limited due to pain. He is in a sling and swath. He is able to internally and externally rotate without any significant discomfort. He has no palpable sulcus. Normal sensory and motor function throughout his arm. Neurovascularly intact distally.    IMAGES: X-rays reviewed by me:   X-rays were reviewed on the [facility] from 06-15-15 demonstrate a dislocated shoulder on pre-reduction films. This was  successfully reduced.  No acute bony abnormalities are noted.   ASSESSMENT AND PLAN: After discussion with the patient and his mom, we informed him he seems to have quite an unstable shoulder. We will set him up for an MRI arthrogram to evaluate for labral pathology.  We will also make sure there are no other structures injured from his multiple dislocations.   At this point, we discussed the risks and benefits of surgery, including anesthesia, infection and deep venous thrombosis. They would like to proceed with this. We will have them set this up with our surgery scheduler. If anything significantly surprising comes back on his MRI we will contact him. In the meantime, he is given Norco for pain. They can call with any questions or concerns in the interim.    Jewel Baize.  Eulah Pont, M.D.  Dictated by Elodia Florence, PA-C Electronically verified by Jewel Baize Eulah Pont, M.D. TDM: (AN): jgc  D  06-16-15  T  06-18-15

## 2015-07-02 NOTE — Transfer of Care (Signed)
Immediate Anesthesia Transfer of Care Note  Patient: Caleb Kent  Procedure(s) Performed: Procedure(s): LEFT SHOULDER ARTHROSCOPY WITH BANKART REPAIR (Left)  Patient Location: PACU  Anesthesia Type:GA combined with regional for post-op pain  Level of Consciousness: awake, alert , oriented and patient cooperative  Airway & Oxygen Therapy: Patient Spontanous Breathing and Patient connected to face mask oxygen  Post-op Assessment: Report given to RN and Post -op Vital signs reviewed and stable  Post vital signs: Reviewed and stable  Last Vitals:  Filed Vitals:   07/02/15 1150 07/02/15 1200  BP:  131/85  Pulse: 84 90  Temp:    Resp: 16 12    Complications: No apparent anesthesia complications

## 2015-07-02 NOTE — Op Note (Signed)
07/02/2015  1:58 PM  PATIENT:  Caleb Kent    PRE-OPERATIVE DIAGNOSIS:  Unspecified fracture of unspecified forearm, initial encounter for closed fracture, other articular cartilage disorders, left shoulder  S52.90XA,  M24.112  POST-OPERATIVE DIAGNOSIS:  Same  PROCEDURE:  LEFT SHOULDER ARTHROSCOPY WITH BANKART REPAIR  SURGEON:  Peder Allums, Jewel BaizeIMOTHY D, MD  ASSISTANT: Dennard SchaumannBrian Petrarcha  ANESTHESIA:   gen  PREOPERATIVE INDICATIONS:  Caleb Kent is a  20 y.o. male with a diagnosis of Unspecified fracture of unspecified forearm, initial encounter for closed fracture, other articular cartilage disorders, left shoulder  S52.90XA,  M24.112 who failed conservative measures and elected for surgical management.    The risks benefits and alternatives were discussed with the patient preoperatively including but not limited to the risks of infection, bleeding, nerve injury, cardiopulmonary complications, the need for revision surgery, among others, and the patient was willing to proceed.  OPERATIVE IMPLANTS: 2.9 pushlock anchor  OPERATIVE FINDINGS: beaufort complex, ant/inf bankart, hill sachs  BLOOD LOSS: min  COMPLICATIONS: none  OPERATIVE PROCEDURE:  Patient was identified in the preoperative holding area and site was marked by me He was transported to the operating theater and placed on the table in supine position taking care to pad all bony prominences. After a preincinduction time out anesthesia was induced. His endplates and a partial lateral position again padding all bony prominences The left upper extremity was prepped and draped in normal sterile fashion and a pre-incision timeout was performed. He received ancef for preoperative antibiotics.   Made a posterior arthroscopic portal and inserted the arthroscope. I took a tour of the shoulder he had a small Hill-Sachs he had an anterior inferior Bankart injury with medial displacement he had a Beaufort complex superiorly. I  then made an anterior portal and inserted a cannula directly over the subscap's his rotator cuff was normal in appearance.  Remainder of his labrum was normal appearance he had no chondral pathology. I then used direct visualization to create a second anterior arthroscopic portal and inserted a trocar here as well.  Next I used a spatula to elevate and mobilize the Bankart tear from where it inserted to scar in. This allowed me to mobilize it back up to the glenoid rim.  I then placed a horizontal stitch around this Bankart injury and placed into a 2. 9 push lock anchor.  I was very happy with the apposition this created and the tightening of his anterior-inferior glenohumeral ligament.  I examined the superior aspect of his anterior labrum this was clearly a Beaufort complex so elected not to place a second anchor through this as it did not attach to the remainder of the capsule or labrum.  I then removed our scopic equipment expressed all fluid and closed his portals with simple stitches placed a sterile dressing placed in a sling he was awoken and taken the PACU in stable condition.  POST OPERATIVE PLAN: Sling full time and elected for DVT prophylaxis    This note was generated using a template and dragon dictation system. In light of that, I have reviewed the note and all aspects of it are applicable to this case. Any dictation errors are due to the computerized dictation system.

## 2015-07-02 NOTE — Interval H&P Note (Signed)
History and Physical Interval Note:  07/02/2015 8:29 AM  Caleb Kent  has presented today for surgery, with the diagnosis of Unspecified fracture of unspecified forearm, initial encounter for closed fracture, other articular cartilage disorders, left shoulder  S52.90XA,  M24.112  The various methods of treatment have been discussed with the patient and family. After consideration of risks, benefits and other options for treatment, the patient has consented to  Procedure(s): LEFT SHOULDER ARTHROSCOPY WITH BANKART REPAIR (Left) as a surgical intervention .  The patient's history has been reviewed, patient examined, no change in status, stable for surgery.  I have reviewed the patient's chart and labs.  Questions were answered to the patient's satisfaction.     Phelan Schadt D

## 2015-07-02 NOTE — Anesthesia Postprocedure Evaluation (Signed)
Anesthesia Post Note  Patient: Caleb Kent  Procedure(s) Performed: Procedure(s) (LRB): LEFT SHOULDER ARTHROSCOPY WITH BANKART REPAIR (Left)  Patient location during evaluation: PACU Anesthesia Type: General Level of consciousness: awake and alert Pain management: pain level controlled Vital Signs Assessment: post-procedure vital signs reviewed and stable Respiratory status: spontaneous breathing, nonlabored ventilation, respiratory function stable and patient connected to nasal cannula oxygen Cardiovascular status: blood pressure returned to baseline and stable Postop Assessment: no signs of nausea or vomiting Anesthetic complications: no    Last Vitals:  Filed Vitals:   07/02/15 1445 07/02/15 1520  BP: 124/89 133/85  Pulse: 65 74  Temp:  36.6 C  Resp: 23 18    Last Pain:  Filed Vitals:   07/02/15 1524  PainSc: 0-No pain                 Tinisha Etzkorn L

## 2015-07-02 NOTE — Anesthesia Procedure Notes (Addendum)
Anesthesia Regional Block:  Interscalene brachial plexus block  Pre-Anesthetic Checklist: ,, timeout performed, Correct Patient, Correct Site, Correct Laterality, Correct Procedure, Correct Position, site marked, Risks and benefits discussed,  Surgical consent,  Pre-op evaluation,  At surgeon's request and post-op pain management  Laterality: Left  Prep: chloraprep       Needles:  Injection technique: Single-shot  Needle Type: Echogenic Needle     Needle Length: 13cm 13 cm Needle Gauge: 21 and 21 G    Additional Needles:  Procedures: ultrasound guided (picture in chart) Interscalene brachial plexus block Narrative:  Start time: 07/02/2015 11:35 AM End time: 07/02/2015 11:45 AM Injection made incrementally with aspirations every 5 mL.  Performed by: Personally  Anesthesiologist: Ronelle NighEWELL, CHARLES  Additional Notes: Patient tolerated the procedure well without complications   Procedure Name: Intubation Date/Time: 07/02/2015 1:06 PM Performed by: Jakyria Bleau D Pre-anesthesia Checklist: Patient identified, Emergency Drugs available, Suction available and Patient being monitored Patient Re-evaluated:Patient Re-evaluated prior to inductionOxygen Delivery Method: Circle System Utilized Preoxygenation: Pre-oxygenation with 100% oxygen Intubation Type: IV induction Ventilation: Mask ventilation without difficulty Laryngoscope Size: Mac and 4 Grade View: Grade I Tube type: Oral Tube size: 8.0 mm Number of attempts: 1 Airway Equipment and Method: Stylet and Oral airway Placement Confirmation: ETT inserted through vocal cords under direct vision,  positive ETCO2 and breath sounds checked- equal and bilateral Secured at: 21 cm Tube secured with: Tape Dental Injury: Teeth and Oropharynx as per pre-operative assessment

## 2015-07-05 ENCOUNTER — Encounter (HOSPITAL_BASED_OUTPATIENT_CLINIC_OR_DEPARTMENT_OTHER): Payer: Self-pay | Admitting: Orthopedic Surgery

## 2015-07-14 DIAGNOSIS — M19012 Primary osteoarthritis, left shoulder: Secondary | ICD-10-CM | POA: Diagnosis not present

## 2015-08-16 DIAGNOSIS — M19012 Primary osteoarthritis, left shoulder: Secondary | ICD-10-CM | POA: Diagnosis not present

## 2015-08-17 DIAGNOSIS — M6281 Muscle weakness (generalized): Secondary | ICD-10-CM | POA: Diagnosis not present

## 2015-08-17 DIAGNOSIS — M24412 Recurrent dislocation, left shoulder: Secondary | ICD-10-CM | POA: Diagnosis not present

## 2015-08-17 DIAGNOSIS — M24112 Other articular cartilage disorders, left shoulder: Secondary | ICD-10-CM | POA: Diagnosis not present

## 2015-08-17 DIAGNOSIS — M25512 Pain in left shoulder: Secondary | ICD-10-CM | POA: Diagnosis not present

## 2015-09-09 ENCOUNTER — Encounter (HOSPITAL_COMMUNITY): Payer: Self-pay | Admitting: Emergency Medicine

## 2015-09-09 ENCOUNTER — Emergency Department (HOSPITAL_COMMUNITY): Payer: BLUE CROSS/BLUE SHIELD

## 2015-09-09 ENCOUNTER — Emergency Department (HOSPITAL_COMMUNITY)
Admission: EM | Admit: 2015-09-09 | Discharge: 2015-09-09 | Disposition: A | Discharge status: Home / Self Care | Payer: BLUE CROSS/BLUE SHIELD | Attending: Emergency Medicine | Admitting: Emergency Medicine

## 2015-09-09 DIAGNOSIS — J45909 Unspecified asthma, uncomplicated: Secondary | ICD-10-CM | POA: Diagnosis not present

## 2015-09-09 DIAGNOSIS — M24112 Other articular cartilage disorders, left shoulder: Secondary | ICD-10-CM | POA: Diagnosis not present

## 2015-09-09 DIAGNOSIS — W109XXA Fall (on) (from) unspecified stairs and steps, initial encounter: Secondary | ICD-10-CM | POA: Diagnosis not present

## 2015-09-09 DIAGNOSIS — Y999 Unspecified external cause status: Secondary | ICD-10-CM | POA: Insufficient documentation

## 2015-09-09 DIAGNOSIS — S43015A Anterior dislocation of left humerus, initial encounter: Secondary | ICD-10-CM | POA: Insufficient documentation

## 2015-09-09 DIAGNOSIS — Z79899 Other long term (current) drug therapy: Secondary | ICD-10-CM | POA: Insufficient documentation

## 2015-09-09 DIAGNOSIS — F17210 Nicotine dependence, cigarettes, uncomplicated: Secondary | ICD-10-CM | POA: Insufficient documentation

## 2015-09-09 DIAGNOSIS — Y929 Unspecified place or not applicable: Secondary | ICD-10-CM | POA: Diagnosis not present

## 2015-09-09 DIAGNOSIS — S43005A Unspecified dislocation of left shoulder joint, initial encounter: Secondary | ICD-10-CM

## 2015-09-09 DIAGNOSIS — Y939 Activity, unspecified: Secondary | ICD-10-CM | POA: Diagnosis not present

## 2015-09-09 DIAGNOSIS — S4992XA Unspecified injury of left shoulder and upper arm, initial encounter: Secondary | ICD-10-CM | POA: Diagnosis present

## 2015-09-09 MED ORDER — PROPOFOL 10 MG/ML IV BOLUS
INTRAVENOUS | Status: AC
  Administered 2015-09-09: 110 mg
  Filled 2015-09-09: qty 20

## 2015-09-09 NOTE — ED Notes (Signed)
Left shoulder obviously dislocated-- fell down steps today, spent all morning at Dr. [name] RightMurphy's office -- trying to have it put back in-- sent here for sedation for reduction. Dr. Eulah PontMurphy notified partner in OR . Had surgery in March for same

## 2015-09-09 NOTE — ED Notes (Signed)
Pt is in stable condition upon d/c and ambulates from ED. 

## 2015-09-09 NOTE — Discharge Instructions (Signed)
Wear your shoulder sling for the next several days.  Ice for 20 minutes every 2 hours while awake for the next 2 days.  Please follow-up with your orthopedic surgeon in the next week to be rechecked.   Shoulder Dislocation A shoulder dislocation happens when the upper arm bone (humerus) moves out of the shoulder joint. The shoulder joint is the part of the shoulder where the humerus, shoulder blade (scapula), and collarbone (clavicle) meet. CAUSES This condition is often caused by:  A fall.  A hit to the shoulder.  A forceful movement of the shoulder. RISK FACTORS This condition is more likely to develop in people who play sports. SYMPTOMS Symptoms of this condition include:  Deformity of the shoulder.  Intense pain.  Inability to move the shoulder.  Numbness, weakness, or tingling in your neck or down your arm.  Bruising or swelling around your shoulder. DIAGNOSIS This condition is diagnosed with a physical exam. After the exam, tests may be done to check for related problems. Tests that may be done include:  X-ray. This may be done to check for broken bones.  MRI. This may be done to check for damage to the tissues around the shoulder.  Electromyogram. This may be done to check for nerve damage. TREATMENT This condition is treated with a procedure to place the humerus back in the joint. This procedure is called a reduction. There are two types of reduction:  Closed reduction. In this procedure, the humerus is placed back in the joint without surgery. The health care provider uses his or her hands to guide the bone back into place.  Open reduction. In this procedure, the humerus is placed back in the joint with surgery. An open reduction may be recommended if:  You have a weak shoulder joint or weak ligaments.  You have had more than one shoulder dislocation.  The nerves or blood vessels around your shoulder have been damaged. After the humerus is placed back into  the joint, your arm will be placed in a splint or sling to prevent it from moving. You will need to wear the splint or sling until your shoulder heals. When the splint or sling is removed, you may have physical therapy to help improve the range of motion in your shoulder joint. HOME CARE INSTRUCTIONS If You Have a Splint or Sling:  Wear it as told by your health care provider. Remove it only as told by your health care provider.  Loosen it if your fingers become numb and tingle, or if they turn cold and blue.  Keep it clean and dry. Bathing  Do not take baths, swim, or use a hot tub until your health care provider approves. Ask your health care provider if you can take showers. You may only be allowed to take sponge baths for bathing.  If your health care provider approves bathing and showering, cover your splint or sling with a watertight plastic bag to protect it from water. Do not let the splint or sling get wet. Managing Pain, Stiffness, and Swelling  If directed, apply ice to the injured area.  Put ice in a plastic bag.  Place a towel between your skin and the bag.  Leave the ice on for 20 minutes, 2-3 times per day.  Move your fingers often to avoid stiffness and to decrease swelling.  Raise (elevate) the injured area above the level of your heart while you are sitting or lying down. Driving  Do not drive while wearing  a splint or sling on a hand that you use for driving.  Do not drive or operate heavy machinery while taking pain medicine. Activity  Return to your normal activities as told by your health care provider. Ask your health care provider what activities are safe for you.  Perform range-of-motion exercises only as told by your health care provider.  Exercise your hand by squeezing a soft ball. This helps to decrease stiffness and swelling in your hand and wrist. General Instructions  Take over-the-counter and prescription medicines only as told by your health  care provider.  Do not use any tobacco products, including cigarettes, chewing tobacco, or e-cigarettes. Tobacco can delay bone and tissue healing. If you need help quitting, ask your health care provider.  Keep all follow-up visits as told by your health care provider. This is important. SEEK MEDICAL CARE IF:  Your splint or sling gets damaged. SEEK IMMEDIATE MEDICAL CARE IF:  Your pain gets worse rather than better.  You lose feeling in your arm or hand.  Your arm or hand becomes white and cold.   This information is not intended to replace advice given to you by your health care provider. Make sure you discuss any questions you have with your health care provider.   Document Released: 12/13/2000 Document Revised: 12/09/2014 Document Reviewed: 07/13/2014 Elsevier Interactive Patient Education [employer]

## 2015-09-09 NOTE — ED Provider Notes (Signed)
CSN: [id number]     Arrival date & time 09/09/15  1143 History   First MD Initiated Contact with Patient 09/09/15 1248     Chief Complaint  Patient presents with  . dislocated shoulder      (Consider location/radiation/quality/duration/timing/severity/associated sxs/prior Treatment) HPI Comments: Patient is 20 year old male with past medical history of asthma and recurrent shoulder dislocations. He underwent surgery to firm up his shoulder joint in March of this year. This morning he fell down several stairs and reinjured his shoulder. He was at the orthopedic office this morning and they attempted reduction which was unsuccessful. He was sent here for conscious sedation and further reduction attempts. Patient denies other injury.  The history is provided by the patient.    Past Medical History  Diagnosis Date  . Asthma   . ADHD (attention deficit hyperactivity disorder)   . Strabismus     sees Dr. Maple HudsonYoung  . GERD (gastroesophageal reflux disease)   . Tourette syndrome     resolved   Past Surgical History  Procedure Laterality Date  . Eye surgery Right   . Shoulder arthroscopy with bankart repair Left 07/02/2015    Procedure: LEFT SHOULDER ARTHROSCOPY WITH BANKART REPAIR;  Surgeon: Sheral Apleyimothy D Murphy, MD;  Location: [facility];  Service: Orthopedics;  Laterality: Left;   History reviewed. No pertinent family history. Social History  Substance Use Topics  . Smoking status: Current Every Day Smoker -- 0.25 packs/day    Types: Cigarettes  . Smokeless tobacco: Never Used  . Alcohol Use: No    Review of Systems  All other systems reviewed and are negative.     Allergies  Review of patient's allergies indicates no known allergies.  Home Medications   Prior to Admission medications   Medication Sig Start Date End Date Taking? Authorizing Provider  albuterol (PROVENTIL HFA;VENTOLIN HFA) 108 (90 BASE) MCG/ACT inhaler Inhale 2 puffs into the lungs every 6 (six)  hours as needed for wheezing.    Historical Provider, MD  ondansetron (ZOFRAN ODT) 4 MG disintegrating tablet Take 1 tablet (4 mg total) by mouth every 8 (eight) hours as needed for nausea or vomiting. 07/02/15   Jetty PeeksBrian D Petrarca, PA-C  oxyCODONE-acetaminophen (ROXICET) 5-325 MG tablet Take 1-2 tablets by mouth every 4 (four) hours as needed for severe pain. 07/02/15   Oris DroneBrian D Petrarca, PA-C   BP 139/89 mmHg  Pulse 66  Temp(Src) 98.7 F (37.1 C) (Oral)  Resp 16  SpO2 100% Physical Exam  Constitutional: He is oriented to person, place, and time. He appears well-developed and well-nourished. No distress.  HENT:  Head: Normocephalic and atraumatic.  Neck: Normal range of motion. Neck supple.  Musculoskeletal:  The left shoulder has what appears to be a glenohumeral dislocation. Ulnar and radial pulses are palpable. He is able to flex, extend, and oppose all fingers. Sensation is intact throughout the entire hand.  Neurological: He is alert and oriented to person, place, and time.  Skin: Skin is warm and dry. He is not diaphoretic.  Nursing note and vitals reviewed.   ED Course  Reduction of dislocation Date/Time: 09/09/2015 2:12 PM Performed by: Geoffery LyonsELO, Olita Takeshita Authorized by: Geoffery LyonsELO, Yumna Ebers Consent: Verbal consent obtained. Written consent obtained. Risks and benefits: risks, benefits and alternatives were discussed Consent given by: patient Patient understanding: patient states understanding of the procedure being performed Patient consent: the patient's understanding of the procedure matches consent given Procedure consent: procedure consent matches procedure scheduled Relevant documents: relevant documents present and  verified Test results: test results available and properly labeled Site marked: the operative site was marked Imaging studies: imaging studies available Patient identity confirmed: verbally with patient Time out: Immediately prior to procedure a "time out" was called to  verify the correct patient, procedure, equipment, support staff and site/side marked as required. Local anesthesia used: no Patient sedated: yes Sedation type: moderate (conscious) sedation Sedatives: propofol Vitals: Vital signs were monitored during sedation. Patient tolerance: Patient tolerated the procedure well with no immediate complications Comments: Shoulder reduced with traction-countertraction technique. Distal PMS is intact pre-and post reduction. He tolerated the procedure well. Postreduction films reveal successful reduction.   (including critical care time) Labs Review Labs Reviewed - No data to display  Imaging Review No results found. I have personally reviewed and evaluated these images and lab results as part of my medical decision-making.   EKG Interpretation None      MDM   Final diagnoses:  None    Reduction successful under conscious sedation. He will be discharged with an arm sling and follow-up with his orthopedic surgeon.    Geoffery Lyons, MD 09/09/15 1414

## 2015-09-15 DIAGNOSIS — M24112 Other articular cartilage disorders, left shoulder: Secondary | ICD-10-CM | POA: Diagnosis not present

## 2015-09-22 DIAGNOSIS — M25512 Pain in left shoulder: Secondary | ICD-10-CM | POA: Diagnosis not present

## 2015-09-27 DIAGNOSIS — M25512 Pain in left shoulder: Secondary | ICD-10-CM | POA: Diagnosis not present

## 2015-11-08 DIAGNOSIS — M25512 Pain in left shoulder: Secondary | ICD-10-CM | POA: Diagnosis not present

## 2016-01-29 DIAGNOSIS — S43005A Unspecified dislocation of left shoulder joint, initial encounter: Secondary | ICD-10-CM | POA: Diagnosis not present

## 2016-02-02 ENCOUNTER — Other Ambulatory Visit: Payer: Self-pay | Admitting: Orthopedic Surgery

## 2016-02-02 DIAGNOSIS — M8580 Other specified disorders of bone density and structure, unspecified site: Secondary | ICD-10-CM

## 2016-02-02 DIAGNOSIS — S43005D Unspecified dislocation of left shoulder joint, subsequent encounter: Secondary | ICD-10-CM | POA: Diagnosis not present

## 2016-02-02 DIAGNOSIS — M898X9 Other specified disorders of bone, unspecified site: Secondary | ICD-10-CM

## 2016-02-07 ENCOUNTER — Ambulatory Visit
Admission: RE | Admit: 2016-02-07 | Discharge: 2016-02-07 | Disposition: A | Discharge status: Home / Self Care | Payer: BLUE CROSS/BLUE SHIELD | Source: Ambulatory Visit | Attending: Orthopedic Surgery | Admitting: Orthopedic Surgery

## 2016-02-07 DIAGNOSIS — M8580 Other specified disorders of bone density and structure, unspecified site: Secondary | ICD-10-CM

## 2016-02-07 DIAGNOSIS — M898X9 Other specified disorders of bone, unspecified site: Secondary | ICD-10-CM

## 2016-02-07 DIAGNOSIS — S43005A Unspecified dislocation of left shoulder joint, initial encounter: Secondary | ICD-10-CM | POA: Diagnosis not present

## 2016-02-16 DIAGNOSIS — S43005D Unspecified dislocation of left shoulder joint, subsequent encounter: Secondary | ICD-10-CM | POA: Diagnosis not present

## 2016-02-18 ENCOUNTER — Other Ambulatory Visit (HOSPITAL_COMMUNITY): Payer: Self-pay | Admitting: Orthopaedic Surgery

## 2016-02-18 DIAGNOSIS — M25312 Other instability, left shoulder: Secondary | ICD-10-CM

## 2016-02-21 ENCOUNTER — Other Ambulatory Visit (HOSPITAL_COMMUNITY): Payer: Self-pay | Admitting: Orthopaedic Surgery

## 2016-02-21 DIAGNOSIS — M25312 Other instability, left shoulder: Secondary | ICD-10-CM

## 2016-02-23 ENCOUNTER — Emergency Department (HOSPITAL_COMMUNITY): Payer: BLUE CROSS/BLUE SHIELD

## 2016-02-23 ENCOUNTER — Encounter (HOSPITAL_COMMUNITY): Payer: Self-pay

## 2016-02-23 ENCOUNTER — Emergency Department (HOSPITAL_COMMUNITY)
Admission: EM | Admit: 2016-02-23 | Discharge: 2016-02-23 | Disposition: A | Discharge status: Home / Self Care | Payer: BLUE CROSS/BLUE SHIELD | Attending: Emergency Medicine | Admitting: Emergency Medicine

## 2016-02-23 DIAGNOSIS — J45909 Unspecified asthma, uncomplicated: Secondary | ICD-10-CM | POA: Diagnosis not present

## 2016-02-23 DIAGNOSIS — F909 Attention-deficit hyperactivity disorder, unspecified type: Secondary | ICD-10-CM | POA: Diagnosis not present

## 2016-02-23 DIAGNOSIS — S43015A Anterior dislocation of left humerus, initial encounter: Secondary | ICD-10-CM | POA: Diagnosis not present

## 2016-02-23 DIAGNOSIS — F17210 Nicotine dependence, cigarettes, uncomplicated: Secondary | ICD-10-CM | POA: Insufficient documentation

## 2016-02-23 DIAGNOSIS — M24412 Recurrent dislocation, left shoulder: Secondary | ICD-10-CM | POA: Diagnosis not present

## 2016-02-23 DIAGNOSIS — M25512 Pain in left shoulder: Secondary | ICD-10-CM | POA: Diagnosis present

## 2016-02-23 MED ORDER — HYDROMORPHONE HCL 2 MG/ML IJ SOLN
2.0000 mg | Freq: Once | INTRAMUSCULAR | Status: AC
  Administered 2016-02-23: 2 mg via INTRAMUSCULAR
  Filled 2016-02-23: qty 1

## 2016-02-23 MED ORDER — ONDANSETRON HCL 4 MG/2ML IJ SOLN
4.0000 mg | Freq: Once | INTRAMUSCULAR | Status: AC
  Administered 2016-02-23: 4 mg via INTRAVENOUS
  Filled 2016-02-23: qty 2

## 2016-02-23 MED ORDER — PROPOFOL 10 MG/ML IV BOLUS
100.0000 mg | Freq: Once | INTRAVENOUS | Status: DC
  Filled 2016-02-23: qty 20

## 2016-02-23 NOTE — ED Provider Notes (Signed)
[facility] Provider Note   CSN: [id number] Arrival date & time: 02/23/16  [id number]     History   Chief Complaint Chief Complaint  Patient presents with  . Shoulder Pain    HPI Caleb Kent is a 20 y.o. male.  HPI Patient's had multiple left shoulder dislocations. Had arthroscopic surgery back in March of this year in the left shoulder. States he saw Dr. Madilyn FiremanHayes last week for second opinion. Has an MRI scheduled for next week. Was driving in his car when he sneezed. He has spontaneous dislocation of his left shoulder. States occurred roughly at 8:30. Denies any hand or arm numbness or weakness. States he's been able to reduce his shoulder dislocation several times in the past without need for medication. Past Medical History:  Diagnosis Date  . ADHD (attention deficit hyperactivity disorder)   . Asthma   . GERD (gastroesophageal reflux disease)   . Strabismus    sees Dr. Maple HudsonYoung  . Tourette syndrome    resolved    Patient Active Problem List   Diagnosis Date Noted  . Labral tear of shoulder 07/02/2015  . VIRAL URI 07/14/2009  . ABDOMINAL PAIN, EPIGASTRIC 07/07/2009  . KNEE SPRAIN 05/25/2009  . TOURETTE'S DISORDER 03/05/2009  . VIRAL GASTROENTERITIS 04/23/2008  . ADHD 01/08/2008  . ALLERGIC RHINITIS 05/15/2007  . ASTHMA 12/26/2006    Past Surgical History:  Procedure Laterality Date  . EYE SURGERY Right   . SHOULDER ARTHROSCOPY WITH BANKART REPAIR Left 07/02/2015   Procedure: LEFT SHOULDER ARTHROSCOPY WITH BANKART REPAIR;  Surgeon: Sheral Apleyimothy D Murphy, MD;  Location: [facility];  Service: Orthopedics;  Laterality: Left;       Home Medications    Prior to Admission medications   Medication Sig Start Date End Date Taking? Authorizing Provider  albuterol (PROVENTIL HFA;VENTOLIN HFA) 108 (90 BASE) MCG/ACT inhaler Inhale 2 puffs into the lungs every 6 (six) hours as needed for wheezing.   Yes Historical Provider, MD    Family  History History reviewed. No pertinent family history.  Social History Social History  Substance Use Topics  . Smoking status: Current Every Day Smoker    Packs/day: 0.25    Types: Cigarettes  . Smokeless tobacco: Never Used  . Alcohol use No     Allergies   Patient has no known allergies.   Review of Systems Review of Systems  Constitutional: Negative for chills and fever.  Respiratory: Negative for shortness of breath.   Cardiovascular: Negative for chest pain.  Gastrointestinal: Negative for abdominal pain, nausea and vomiting.  Musculoskeletal: Positive for arthralgias. Negative for back pain, myalgias and neck pain.  Skin: Negative for rash and wound.  Neurological: Negative for weakness and numbness.  All other systems reviewed and are negative.    Physical Exam Updated Vital Signs BP 135/89 (BP Location: Right Arm)   Pulse 89   Temp 98.9 F (37.2 C) (Oral)   Resp 18   Ht 5\' 9"  (1.753 m)   Wt 224 lb (101.6 kg)   SpO2 98%   BMI 33.08 kg/m   Physical Exam  Constitutional: He is oriented to person, place, and time. He appears well-developed and well-nourished. No distress.  Patient does not appear to be in any distress.  HENT:  Head: Normocephalic and atraumatic.  Eyes: EOM are normal. Pupils are equal, round, and reactive to light.  Neck: Normal range of motion. Neck supple.  Cardiovascular: Normal rate and regular rhythm.   Pulmonary/Chest: Effort normal and breath  sounds normal.  Abdominal: Soft. Bowel sounds are normal.  Musculoskeletal: He exhibits deformity. He exhibits no edema or tenderness.  Step-off deformity of the left shoulder. Patient holding the left arm flexed at the elbow close to his trunk. Limited movement of the left shoulder. 2+ radial pulses bilaterally.  Neurological: He is alert and oriented to person, place, and time.  5/5 grip strength bilaterally. Sensation is fully intact  Skin: Skin is warm and dry. Capillary refill takes less  than 2 seconds. No rash noted. No erythema.  Psychiatric: He has a normal mood and affect. His behavior is normal.  Nursing note and vitals reviewed.    ED Treatments / Results  Labs (all labs ordered are listed, but only abnormal results are displayed) Labs Reviewed - No data to display  EKG  EKG Interpretation None       Radiology Dg Shoulder Left  Result Date: 02/23/2016 CLINICAL DATA:  Left shoulder pain cough postreduction EXAM: LEFT SHOULDER - 2+ VIEW COMPARISON:  02/23/2016 FINDINGS: Two views of the left shoulder submitted. Postreduction left humeral head in anatomic alignment glenohumeral joint. IMPRESSION: Postreduction normal alignment of left humeral head. Electronically Signed   By: Natasha MeadLiviu  Pop M.D.   On: 02/23/2016 13:23   Dg Shoulder Left  Result Date: 02/23/2016 CLINICAL DATA:  Dislocation while sneezing EXAM: LEFT SHOULDER - 2+ VIEW COMPARISON:  02/07/2016 FINDINGS: There is anterior inferior dislocation of the humeral head with respect to the glenoid. No acute fracture is identified. No other focal abnormality is seen. IMPRESSION: Anterior inferior dislocation of the left humerus. Electronically Signed   By: Alcide CleverMark  Lukens M.D.   On: 02/23/2016 10:10    Procedures Reduction of dislocation Date/Time: 02/23/2016 12:33 PM Performed by: Loren RacerYELVERTON, Dartanyon Frankowski Authorized by: Ranae PalmsYELVERTON, Takira Sherrin  Consent: Verbal consent obtained. Local anesthesia used: no  Anesthesia: Local anesthesia used: no  Sedation: Patient sedated: no Patient tolerance: Patient tolerated the procedure well with no immediate complications Comments: Patient was given IM 2 mg  Of Dilaudid and placed in prone position on stretcher with left upper extremity hanging over the edge. 1 liter bag  normal saline attached to left wrist. Reduction of left shoulder dislocation after 15-20 minutes with are torn of normal contours. Neurovascular intact. Placed in shoulder immobilizer. Confirmed with postreduction  imaging.    (including critical care time)  Medications Ordered in ED Medications  HYDROmorphone (DILAUDID) injection 2 mg (2 mg Intramuscular Given 02/23/16 1043)     Initial Impression / Assessment and Plan / ED Course  I have reviewed the triage vital signs and the nursing notes.  Pertinent labs & imaging results that were available during my care of the patient were reviewed by me and considered in my medical decision making (see chart for details).  Clinical Course     We'll treat with IM pain medication and attempt to reduce. If unable, will resort to moderate sedation. Pain is improved after IM medications but patient was unable to fully cooperate with reduction attempt. Will prepare for procedural sedation.   In preparation for procedural sedation and reduction patient placed in prone position with left arm hanging over the side of the stretcher. A 1 liter bag of normal saline was attached to left wrist with Kerlix. Patient now believes the shoulder has reduced. Reexamination demonstrated no step-offs and normal contours to the deltoid. Distal pulses are normal. Sensation is intact with no motor weakness. We'll confirm with x-ray and placed in shoulder immobilizer.  Post-reduction film with successful  reduction of left humerus. No fractures identified. We'll discharge home to follow-up with his orthopedist. Final Clinical Impressions(s) / ED Diagnoses   Final diagnoses:  Shoulder dislocation, recurrent, left    New Prescriptions New Prescriptions   No medications on file     Loren Racer, MD 02/23/16 1330

## 2016-02-23 NOTE — ED Notes (Signed)
Dr. Ranae PalmsYelverton is here and begins to manipulate his shoulder.

## 2016-02-23 NOTE — ED Notes (Signed)
He had vomited x 1 at 1348 hours. IV med. Given prior to d/c per Dr. Ranae PalmsYelverton.

## 2016-02-23 NOTE — ED Triage Notes (Signed)
Pt c/o L shoulder pain r/t dislocation.  Pt reports "I sneezed and it popped out."  Pain score 5/10.  Pt reports shoulder surgery in March 2017 and sts shoulder has dislocated "too many times to count" since surgery.  Pt has [facility] in 1 [facility].  Pt is followed by Eulah PontMurphy MD and Madilyn FiremanHayes MD.

## 2016-02-23 NOTE — ED Notes (Signed)
Previously I had had pt. Lie on his stomach and attached a liter bag of IVF; which seems to have worked. He is sitting up and feels much better. Our ortho. Tech. Has been in and applied shoulder immobilizer.

## 2016-02-23 NOTE — ED Notes (Signed)
After repositioning slightly pt sts "I think it's back in". Dr Ranae PalmsYelverton notified, he will reevaluate pt.

## 2016-03-06 ENCOUNTER — Ambulatory Visit (HOSPITAL_COMMUNITY)
Admission: RE | Admit: 2016-03-06 | Discharge: 2016-03-06 | Disposition: A | Discharge status: Home / Self Care | Payer: BLUE CROSS/BLUE SHIELD | Source: Ambulatory Visit | Attending: Orthopaedic Surgery | Admitting: Orthopaedic Surgery

## 2016-03-06 DIAGNOSIS — M25312 Other instability, left shoulder: Secondary | ICD-10-CM | POA: Diagnosis not present

## 2016-03-06 DIAGNOSIS — S43432A Superior glenoid labrum lesion of left shoulder, initial encounter: Secondary | ICD-10-CM | POA: Diagnosis not present

## 2016-03-06 DIAGNOSIS — S43402A Unspecified sprain of left shoulder joint, initial encounter: Secondary | ICD-10-CM | POA: Insufficient documentation

## 2016-03-06 MED ORDER — IOPAMIDOL (ISOVUE-M 200) INJECTION 41%
20.0000 mL | Freq: Once | INTRAMUSCULAR | Status: AC
  Administered 2016-03-06: 15 mL via INTRA_ARTICULAR

## 2016-03-06 MED ORDER — GADOBENATE DIMEGLUMINE 529 MG/ML IV SOLN
5.0000 mL | Freq: Once | INTRAVENOUS | Status: AC | PRN
  Administered 2016-03-06: 0.5 mL via INTRA_ARTICULAR
  Administered 2016-03-06: 11:00:00 via INTRA_ARTICULAR

## 2016-03-06 MED ORDER — LIDOCAINE HCL (PF) 1 % IJ SOLN
10.0000 mL | Freq: Once | INTRAMUSCULAR | Status: AC
  Administered 2016-03-06: 10 mL via INTRADERMAL

## 2016-03-06 NOTE — Procedures (Signed)
CLINICAL DATA: [Left shoulder joint instability.  Prior bony Bankart lesion.] EXAM: [Left shoulder INJECTION UNDER FLUOROSCOPY FLUOROSCOPY TIME: Fluoroscopy Time: [0 minutes, 42 seconds] Radiation Exposure Index (if provided by the fluoroscopic device): [4.1 mGy] Number of Acquired Spot Images: [0] PROCEDURE: I discussed the risks (including hemorrhage, infection, and allergic reaction, among others), benefits, and alternatives to the procedure with the patient. We specifically discussed the high technical likelihood of success of the procedure. The patient understood and elected to undergo the procedure.   Standard time-out was employed. Following sterile skin prep and local anesthetic administration consisting of 1% lidocaine, a 22 gauge needle was advanced without difficulty into the [left glenohumeral] joint under fluoroscopic guidance.  A total of 12 cc of a combination of 12 cc Omnipaque 180, 5 cc of 1% lidocaine, and 0.1 cc of Magnevist was injected into the joint. The needle was subsequently removed and the skin cleansed and bandaged. No immediate complications were observed.    IMPRESSION: [ 1. Technically successful left glenohumeral joint contrast injection under fluoroscopy, pre MRI.  ]

## 2016-03-17 ENCOUNTER — Telehealth: Payer: Self-pay | Admitting: Family Medicine

## 2016-03-17 DIAGNOSIS — M25312 Other instability, left shoulder: Secondary | ICD-10-CM | POA: Diagnosis not present

## 2016-03-17 NOTE — Telephone Encounter (Signed)
rx is ready to fax  

## 2016-03-17 NOTE — Telephone Encounter (Signed)
Pt mom is requesting new nebulizer and for us to call [employer] to get fax # phone # [phone]

## 2016-03-20 NOTE — Telephone Encounter (Signed)
I faxed to [phone] and spoke with mom.

## 2016-04-10 DIAGNOSIS — J45909 Unspecified asthma, uncomplicated: Secondary | ICD-10-CM | POA: Diagnosis not present

## 2016-04-24 ENCOUNTER — Ambulatory Visit (INDEPENDENT_AMBULATORY_CARE_PROVIDER_SITE_OTHER): Payer: BLUE CROSS/BLUE SHIELD | Admitting: Family Medicine

## 2016-04-24 DIAGNOSIS — Z23 Encounter for immunization: Secondary | ICD-10-CM | POA: Diagnosis not present

## 2016-04-26 DIAGNOSIS — Z23 Encounter for immunization: Secondary | ICD-10-CM | POA: Diagnosis not present

## 2016-05-29 DIAGNOSIS — J209 Acute bronchitis, unspecified: Secondary | ICD-10-CM | POA: Diagnosis not present

## 2016-06-01 ENCOUNTER — Ambulatory Visit (INDEPENDENT_AMBULATORY_CARE_PROVIDER_SITE_OTHER): Payer: BLUE CROSS/BLUE SHIELD | Admitting: Family Medicine

## 2016-06-01 ENCOUNTER — Encounter: Payer: Self-pay | Admitting: Family Medicine

## 2016-06-01 VITALS — BP 122/80 | Temp 98.8°F | Ht 69.0 in | Wt 231.0 lb

## 2016-06-01 DIAGNOSIS — Z0000 Encounter for general adult medical examination without abnormal findings: Secondary | ICD-10-CM

## 2016-06-01 LAB — BASIC METABOLIC PANEL
BUN: 12 mg/dL (ref 6–23)
CHLORIDE: 102 meq/L (ref 96–112)
CO2: 28 mEq/L (ref 19–32)
Calcium: 9.7 mg/dL (ref 8.4–10.5)
Creatinine, Ser: 1.09 mg/dL (ref 0.40–1.50)
GFR: 108.67 mL/min (ref >60.00)
GLUCOSE: 84 mg/dL (ref 70–99)
POTASSIUM: 4.1 meq/L (ref 3.5–5.1)
Sodium: 136 mEq/L (ref 135–145)

## 2016-06-01 LAB — CBC WITH DIFFERENTIAL/PLATELET
BASOS ABS: 0.1 10*3/uL (ref 0.0–0.1)
BASOS PCT: 0.8 % (ref 0.0–3.0)
EOS PCT: 4.2 % (ref 0.0–5.0)
Eosinophils Absolute: 0.3 10*3/uL (ref 0.0–0.7)
HCT: 49.9 % (ref 39.0–52.0)
Hemoglobin: 16.6 g/dL (ref 13.0–17.0)
LYMPHS ABS: 2.2 10*3/uL (ref 0.7–4.0)
Lymphocytes Relative: 28 % (ref 12.0–46.0)
MCHC: 33.3 g/dL (ref 30.0–36.0)
MCV: 80.8 fl (ref 78.0–100.0)
MONOS PCT: 7 % (ref 3.0–12.0)
Monocytes Absolute: 0.6 10*3/uL (ref 0.1–1.0)
NEUTROS ABS: 4.7 10*3/uL (ref 1.4–7.7)
NEUTROS PCT: 60 % (ref 43.0–77.0)
PLATELETS: 162 10*3/uL (ref 150.0–400.0)
RBC: 6.17 Mil/uL — ABNORMAL HIGH (ref 4.22–5.81)
RDW: 13.6 % (ref 11.5–15.5)
WBC: 7.9 10*3/uL (ref 4.0–10.5)

## 2016-06-01 LAB — HEPATIC FUNCTION PANEL
ALT: 35 U/L (ref 0–53)
AST: 24 U/L (ref 0–37)
Albumin: 4.5 g/dL (ref 3.5–5.2)
Alkaline Phosphatase: 45 U/L (ref 39–117)
BILIRUBIN TOTAL: 0.6 mg/dL (ref 0.2–1.2)
Bilirubin, Direct: 0.1 mg/dL (ref 0.0–0.3)
Total Protein: 7.3 g/dL (ref 6.0–8.3)

## 2016-06-01 LAB — POC URINALSYSI DIPSTICK (AUTOMATED)
BILIRUBIN UA: NEGATIVE
Blood, UA: NEGATIVE
GLUCOSE UA: NEGATIVE
LEUKOCYTES UA: NEGATIVE
Nitrite, UA: NEGATIVE
PH UA: 5.5
Spec Grav, UA: >=1.03
Urobilinogen, UA: 0.2

## 2016-06-01 LAB — LIPID PANEL
CHOL/HDL RATIO: 5
CHOLESTEROL: 246 mg/dL — AB (ref 0–200)
HDL: 45.2 mg/dL (ref >39.00)
LDL Cholesterol: 176 mg/dL — ABNORMAL HIGH (ref 0–99)
NonHDL: 200.62
TRIGLYCERIDES: 125 mg/dL (ref 0.0–149.0)
VLDL: 25 mg/dL (ref 0.0–40.0)

## 2016-06-01 LAB — TSH: TSH: 2.26 u[IU]/mL (ref 0.35–4.50)

## 2016-06-01 MED ORDER — MOMETASONE FUROATE 50 MCG/ACT NA SUSP: 2.0000 | Freq: Every day | NASAL | 5 refills | Status: DC

## 2016-06-01 MED ORDER — ALBUTEROL SULFATE HFA 108 (90 BASE) MCG/ACT IN AERS: 2.0000 | INHALATION_SPRAY | RESPIRATORY_TRACT | 5 refills | Status: DC | PRN

## 2016-06-01 NOTE — Patient Instructions (Signed)
**  Coming March 12th**  [facility]!!!  Same Day Appointments for Acute Care: Sprains, Injuries, cuts, abrasions Colds, flu, sore throats, cough, upset stomachs Fever, ear pain Sinus and eye infections Animal/insect bites  3 Easy Ways to Schedule: Walk-In Scheduling Call in scheduling Mychart Sign-up: [email or web address]         

## 2016-06-01 NOTE — Progress Notes (Signed)
Pre visit review using our clinic review tool, if applicable. No additional management support is needed unless otherwise documented below in the visit note. 

## 2016-06-01 NOTE — Progress Notes (Signed)
Subjective:    Patient ID: Caleb Kent, male    DOB: Dec 22, 1995, 22 y.o.   MRN: [id number]  HPI 21 yr old male for a well exam. He feels fine in general. He has dislocated his left shoulder multiple times and has had one surgery. His asthma is well controlled.   Review of Systems  Constitutional: Negative.   HENT: Negative.   Eyes: Negative.   Respiratory: Negative.   Cardiovascular: Negative.   Gastrointestinal: Negative.   Genitourinary: Negative.   Musculoskeletal: Negative.   Skin: Negative.   Neurological: Negative.   Psychiatric/Behavioral: Negative.        Objective:   Physical Exam  Constitutional: He is oriented to person, place, and time. He appears well-developed and well-nourished. No distress.  HENT:  Head: Normocephalic and atraumatic.  Right Ear: External ear normal.  Left Ear: External ear normal.  Nose: Nose normal.  Mouth/Throat: Oropharynx is clear and moist. No oropharyngeal exudate.  Eyes: Conjunctivae and EOM are normal. Pupils are equal, round, and reactive to light. Right eye exhibits no discharge. Left eye exhibits no discharge. No scleral icterus.  Neck: Neck supple. No JVD present. No tracheal deviation present. No thyromegaly present.  Cardiovascular: Normal rate, regular rhythm, normal heart sounds and intact distal pulses.  Exam reveals no gallop and no friction rub.   No murmur heard. Pulmonary/Chest: Effort normal and breath sounds normal. No respiratory distress. He has no wheezes. He has no rales. He exhibits no tenderness.  Abdominal: Soft. Bowel sounds are normal. He exhibits no distension and no mass. There is no tenderness. There is no rebound and no guarding.  Genitourinary: Rectum normal, prostate normal and penis normal. Rectal exam shows guaiac negative stool. No penile tenderness.  Musculoskeletal: Normal range of motion. He exhibits no edema or tenderness.  Lymphadenopathy:    He has no cervical adenopathy.    Neurological: He is alert and oriented to person, place, and time. He has normal reflexes. No cranial nerve deficit. He exhibits normal muscle tone. Coordination normal.  Skin: Skin is warm and dry. No rash noted. He is not diaphoretic. No erythema. No pallor.  Psychiatric: He has a normal mood and affect. His behavior is normal. Judgment and thought content normal.          Assessment & Plan:  Well exam. We discussed diet and exercise. Get fasting labs today.  Gershon Crane, MD

## 2016-06-10 ENCOUNTER — Other Ambulatory Visit: Payer: Self-pay | Admitting: Family Medicine

## 2016-06-21 DIAGNOSIS — Z0000 Encounter for general adult medical examination without abnormal findings: Secondary | ICD-10-CM | POA: Diagnosis not present

## 2016-07-06 DIAGNOSIS — J4520 Mild intermittent asthma, uncomplicated: Secondary | ICD-10-CM | POA: Diagnosis not present

## 2016-07-06 DIAGNOSIS — J019 Acute sinusitis, unspecified: Secondary | ICD-10-CM | POA: Diagnosis not present

## 2016-07-06 DIAGNOSIS — H66002 Acute suppurative otitis media without spontaneous rupture of ear drum, left ear: Secondary | ICD-10-CM | POA: Diagnosis not present

## 2016-08-29 ENCOUNTER — Encounter: Payer: Self-pay | Admitting: Family Medicine

## 2016-08-29 ENCOUNTER — Telehealth: Payer: Self-pay | Admitting: Family Medicine

## 2016-08-29 NOTE — Telephone Encounter (Signed)
Form filled out and faxed to [employer] @ [phone].  Received confirmation that the fax was successful.  Copy mailed to home address.

## 2016-09-14 DIAGNOSIS — F909 Attention-deficit hyperactivity disorder, unspecified type: Secondary | ICD-10-CM

## 2016-10-16 ENCOUNTER — Other Ambulatory Visit: Payer: Self-pay | Admitting: Family Medicine

## 2016-12-21 ENCOUNTER — Encounter: Payer: Self-pay | Admitting: Family Medicine

## 2017-01-17 ENCOUNTER — Ambulatory Visit: Payer: BLUE CROSS/BLUE SHIELD | Admitting: Family Medicine

## 2017-01-19 ENCOUNTER — Ambulatory Visit (HOSPITAL_COMMUNITY)
Admission: EM | Admit: 2017-01-19 | Discharge: 2017-01-19 | Disposition: A | Discharge status: Home / Self Care | Payer: BLUE CROSS/BLUE SHIELD | Attending: Internal Medicine | Admitting: Internal Medicine

## 2017-01-19 ENCOUNTER — Encounter (HOSPITAL_COMMUNITY): Payer: Self-pay | Admitting: Emergency Medicine

## 2017-01-19 DIAGNOSIS — H1032 Unspecified acute conjunctivitis, left eye: Secondary | ICD-10-CM

## 2017-01-19 MED ORDER — POLYETHYL GLYCOL-PROPYL GLYCOL 0.4-0.3 % OP GEL: 1.0000 "application " | Freq: Every evening | OPHTHALMIC | 0 refills | Status: DC | PRN

## 2017-01-19 MED ORDER — SULFACETAMIDE SODIUM 10 % OP SOLN: 1.0000 [drp] | Freq: Four times a day (QID) | OPHTHALMIC | 0 refills | Status: AC

## 2017-01-19 MED ORDER — TETRACAINE HCL 0.5 % OP SOLN
OPHTHALMIC | Status: AC
Start: 2017-01-19 — End: 2017-01-19
  Filled 2017-01-19: qty 4

## 2017-01-19 NOTE — ED Triage Notes (Signed)
Pt reports left pink eye onset yest associated w/itching, redness, and watery  Denies fevers, chills  A&O x4... NAD... Ambulatory

## 2017-01-19 NOTE — ED Provider Notes (Signed)
[facility]    CSN: [id number] Arrival date & time: 01/19/17  1202     History   Chief Complaint Chief Complaint  Patient presents with  . Conjunctivitis    HPI Caleb Kent is a 21 y.o. male.   21 year old male comes in for 2 day history of left eye redness, watering, foreign body sensation. Denies photophobia, changes in vision, trauma. Denies allergy symptoms such as sneezing, eye itching. Denies crusting of the eye in the morning. He has been playing with his nephews, who have been diagnosed with pink eye. He has not tried anything for the symptoms. Wears eye glasses for the right eye, which is his weak eye and had surgery done in the past. Left eye without glasses use. Denies contact lens use. Denies URI symptoms such as cough, congestion, sore throat. Denies fever, chills, night sweats.       Past Medical History:  Diagnosis Date  . ADHD (attention deficit hyperactivity disorder)   . Asthma   . GERD (gastroesophageal reflux disease)   . Shoulder separation, left, subsequent encounter    sees Dr. Eulah Pont   . Strabismus    sees Dr. Maple Hudson  . Tourette syndrome    resolved    Patient Active Problem List   Diagnosis Date Noted  . Labral tear of shoulder 07/02/2015  . VIRAL URI 07/14/2009  . ABDOMINAL PAIN, EPIGASTRIC 07/07/2009  . KNEE SPRAIN 05/25/2009  . TOURETTE'S DISORDER 03/05/2009  . VIRAL GASTROENTERITIS 04/23/2008  . ADHD 01/08/2008  . ALLERGIC RHINITIS 05/15/2007  . ASTHMA 12/26/2006    Past Surgical History:  Procedure Laterality Date  . EYE SURGERY Right   . SHOULDER ARTHROSCOPY WITH BANKART REPAIR Left 07/02/2015   Procedure: LEFT SHOULDER ARTHROSCOPY WITH BANKART REPAIR;  Surgeon: Sheral Apley, MD;  Location: [facility];  Service: Orthopedics;  Laterality: Left;       Home Medications    Prior to Admission medications   Medication Sig Start Date End Date Taking? Authorizing Provider  albuterol  (PROVENTIL) (2.5 MG/3ML) 0.083% nebulizer solution USE 1 VIAL PER NEBULIZER EVERY 4 HOURS AS NEEDED FOR WHEEZING OR SHORTNESS OF BREATH 06/13/16   Nelwyn Salisbury, MD  mometasone (NASONEX) 50 MCG/ACT nasal spray Place 2 sprays into the nose daily. 06/01/16   Nelwyn Salisbury, MD  Polyethyl Glycol-Propyl Glycol (SYSTANE) 0.4-0.3 % GEL ophthalmic gel Place 1 application into both eyes at bedtime as needed. 01/19/17   Belinda Fisher, PA-C  PROAIR HFA 108 [phone] Base) MCG/ACT inhaler INHALE 2 PUFFS INTO THE LUNGS EVERY 4 HORUS AS NEEDED FOR WHEEZING/SHORTNESS OF BREATH 10/16/16   Nelwyn Salisbury, MD  sulfacetamide (BLEPH-10) 10 % ophthalmic solution Place 1-2 drops into the left eye every 6 (six) hours. 01/19/17 01/24/17  Belinda Fisher, PA-C    Family History History reviewed. No pertinent family history.  Social History Social History  Substance Use Topics  . Smoking status: Current Every Day Smoker    Packs/day: 0.25    Types: Cigarettes  . Smokeless tobacco: Never Used  . Alcohol use No     Allergies   Patient has no known allergies.   Review of Systems Review of Systems  Reason unable to perform ROS: See HPI as above.     Physical Exam Triage Vital Signs ED Triage Vitals [01/19/17 1240]  Enc Vitals Group     BP 124/79     Pulse Rate 72     Resp 20  Temp 98.4 F (36.9 C)     Temp Source Oral     SpO2 100 %     Weight      Height      Head Circumference      Peak Flow      Pain Score      Pain Loc      Pain Edu?      Excl. in GC?    No data found.   Updated Vital Signs BP 124/79 (BP Location: Left Arm)   Pulse 72   Temp 98.4 F (36.9 C) (Oral)   Resp 20   SpO2 100%   Visual Acuity Right Eye Distance: 20/200 Left Eye Distance: 20/30 Bilateral Distance: 20/30  Right Eye Near:   Left Eye Near:    Bilateral Near:     Physical Exam  Constitutional: He appears well-developed and well-nourished. No distress.  HENT:  Head: Normocephalic and atraumatic.  Eyes: Pupils  are equal, round, and reactive to light. Lids are normal. Lids are everted and swept, no foreign bodies found. Right conjunctiva is not injected. Left conjunctiva is injected.  Injected conjunctiva and sclera of the left eye without ciliary injection. Full EOM, strabismus and nystagmus of the right eye, which is normal for the patient.  Fluorescein stain without uptake.  Neck: Normal range of motion. Neck supple.     UC Treatments / Results  Labs (all labs ordered are listed, but only abnormal results are displayed) Labs Reviewed - No data to display  EKG  EKG Interpretation None       Radiology No results found.  Procedures Procedures (including critical care time)  Medications Ordered in UC Medications - No data to display   Initial Impression / Assessment and Plan / UC Course  I have reviewed the triage vital signs and the nursing notes.  Pertinent labs & imaging results that were available during my care of the patient were reviewed by me and considered in my medical decision making (see chart for details).    Start antibiotic drops as directed. Artificial tears gel as directed. Describes a warm compresses as directed. Patient to follow up with ophthalmology if symptoms worsens or does not improve. Return precautions given.    Final Clinical Impressions(s) / UC Diagnoses   Final diagnoses:  Acute conjunctivitis of left eye, unspecified acute conjunctivitis type    New Prescriptions New Prescriptions   POLYETHYL GLYCOL-PROPYL GLYCOL (SYSTANE) 0.4-0.3 % GEL OPHTHALMIC GEL    Place 1 application into both eyes at bedtime as needed.   SULFACETAMIDE (BLEPH-10) 10 % OPHTHALMIC SOLUTION    Place 1-2 drops into the left eye every 6 (six) hours.     Belinda FisherYu, Amy V, PA-C 01/19/17 1419

## 2017-01-19 NOTE — Discharge Instructions (Signed)
Use sulfacetamide eyedrops as directed on left eye. Artificial tear gel at night. Wait 10-15 minutes between drops, always use artificial tear gel last, as it prevents drops from penetrating through. Lid scrubs and warm compresses as directed. Monitor for any worsening of symptoms, changes in vision, sensitivity to light, eye swelling, follow up with ophthalmology for further evaluation.  ° °

## 2017-01-22 DIAGNOSIS — H10412 Chronic giant papillary conjunctivitis, left eye: Secondary | ICD-10-CM | POA: Diagnosis not present

## 2017-01-22 DIAGNOSIS — H1012 Acute atopic conjunctivitis, left eye: Secondary | ICD-10-CM | POA: Diagnosis not present

## 2017-04-12 ENCOUNTER — Ambulatory Visit (INDEPENDENT_AMBULATORY_CARE_PROVIDER_SITE_OTHER): Payer: BLUE CROSS/BLUE SHIELD

## 2017-04-12 DIAGNOSIS — Z23 Encounter for immunization: Secondary | ICD-10-CM | POA: Diagnosis not present

## 2017-05-05 ENCOUNTER — Other Ambulatory Visit: Payer: Self-pay

## 2017-05-05 ENCOUNTER — Ambulatory Visit (HOSPITAL_COMMUNITY)
Admission: EM | Admit: 2017-05-05 | Discharge: 2017-05-05 | Disposition: A | Discharge status: Home / Self Care | Payer: BLUE CROSS/BLUE SHIELD | Attending: Family Medicine | Admitting: Family Medicine

## 2017-05-05 ENCOUNTER — Encounter (HOSPITAL_COMMUNITY): Payer: Self-pay

## 2017-05-05 DIAGNOSIS — R69 Illness, unspecified: Secondary | ICD-10-CM | POA: Diagnosis not present

## 2017-05-05 DIAGNOSIS — J111 Influenza due to unidentified influenza virus with other respiratory manifestations: Secondary | ICD-10-CM

## 2017-05-05 MED ORDER — HYDROCODONE-HOMATROPINE 5-1.5 MG/5ML PO SYRP: 5.0000 mL | ORAL_SOLUTION | Freq: Four times a day (QID) | ORAL | 0 refills | Status: DC | PRN

## 2017-05-05 NOTE — Discharge Instructions (Signed)

## 2017-05-05 NOTE — ED Triage Notes (Signed)
Patient presents to [facility] with cold symptoms since this morning, pt also complains of vomiting today.

## 2017-05-08 NOTE — ED Provider Notes (Signed)
  [facility]   [id number] 05/05/17 Arrival Time: 1844  ASSESSMENT & PLAN:  1. Influenza-like illness     Meds ordered this encounter  Medications  . HYDROcodone-homatropine (HYCODAN) 5-1.5 MG/5ML syrup    Sig: Take 5 mLs by mouth every 6 (six) hours as needed for cough.    Dispense:  90 mL    Refill:  0   Cough medication sedation precautions. Discussed typical duration of symptoms. OTC symptom care as needed. Ensure adequate fluid intake and rest. May f/u with PCP or here as needed.  Reviewed expectations re: course of current medical issues. Questions answered. Outlined signs and symptoms indicating need for more acute intervention. Patient verbalized understanding. After Visit Summary given.   SUBJECTIVE: History from: family.  Caleb Kent is a 22 y.o. male who presents with complaint of nasal congestion, post-nasal drainage, and a persistent dry cough. Onset abrupt, approximately 1 day ago. Overall fatigued with body aches. SOB: none. Wheezing: none. Fever: yes, subjective. Overall normal PO intake without n/v. Sick contacts: no. OTC treatment: none. One episode of post-tussive emesis.  Received flu shot this year: no.  Social History   Tobacco Use  Smoking Status Current Every Day Smoker  . Packs/day: 0.25  . Types: Cigarettes  Smokeless Tobacco Never Used    ROS: As per HPI.   OBJECTIVE:  Vitals:   05/05/17 1922  BP: 136/86  Pulse: 95  Resp: 18  Temp: (!) 100.7 F (38.2 C)  TempSrc: Oral  SpO2: 99%     General appearance: alert; appears fatigued HEENT: nasal congestion; clear runny nose; throat irritation secondary to post-nasal drainage Neck: supple without LAD Lungs: unlabored respirations, symmetrical air entry; cough: moderate; no respiratory distress Skin: warm and dry Psychological: alert and cooperative; normal mood and affect   No Known Allergies  Past Medical History:  Diagnosis Date  . ADHD (attention  deficit hyperactivity disorder)   . Asthma   . GERD (gastroesophageal reflux disease)   . Shoulder separation, left, subsequent encounter    sees Dr. Eulah PontMurphy   . Strabismus    sees Dr. Maple HudsonYoung  . Tourette syndrome    resolved   History reviewed. No pertinent family history. Social History   Socioeconomic History  . Marital status: Single    Spouse name: n/a  . Number of children: 0  . Years of education: 5513  . Highest education level: Not on file  Social Needs  . Financial resource strain: Not on file  . Food insecurity - worry: Not on file  . Food insecurity - inability: Not on file  . Transportation needs - medical: Not on file  . Transportation needs - non-medical: Not on file  Occupational History  . Occupation: catering    Comment: part-time  Tobacco Use  . Smoking status: Current Every Day Smoker    Packs/day: 0.25    Types: Cigarettes  . Smokeless tobacco: Never Used  Substance and Sexual Activity  . Alcohol use: No    Alcohol/week: 0.0 oz  . Drug use: No  . Sexual activity: Not on file  Other Topics Concern  . Not on file  Social History Narrative   Lives with paternal aunt and uncle.   Negative history of passive tobacco smoke exposure   Care taker verifies today that the child's immunizations are up to date.            Mardella LaymanHagler, Belford Pascucci, MD 05/08/17 [phone]

## 2017-06-04 ENCOUNTER — Ambulatory Visit: Payer: BLUE CROSS/BLUE SHIELD | Admitting: Family Medicine

## 2017-06-04 ENCOUNTER — Encounter: Payer: Self-pay | Admitting: Family Medicine

## 2017-06-04 VITALS — BP 150/84 | HR 78 | Temp 98.7°F | Wt 249.8 lb

## 2017-06-04 DIAGNOSIS — J4520 Mild intermittent asthma, uncomplicated: Secondary | ICD-10-CM

## 2017-06-04 DIAGNOSIS — L929 Granulomatous disorder of the skin and subcutaneous tissue, unspecified: Secondary | ICD-10-CM | POA: Diagnosis not present

## 2017-06-04 MED ORDER — ALBUTEROL SULFATE (2.5 MG/3ML) 0.083% IN NEBU: INHALATION_SOLUTION | RESPIRATORY_TRACT | 3 refills | Status: DC

## 2017-06-04 MED ORDER — ALBUTEROL SULFATE HFA 108 (90 BASE) MCG/ACT IN AERS: INHALATION_SPRAY | RESPIRATORY_TRACT | 3 refills | Status: DC

## 2017-06-04 MED ORDER — MOMETASONE FUROATE 50 MCG/ACT NA SUSP
2.0000 | Freq: Every day | NASAL | 3 refills | Status: DC
Start: 2017-06-04 — End: 2022-01-24

## 2017-06-04 NOTE — Progress Notes (Signed)
Subjective:    Patient ID: Caleb Kent, male    DOB: 04/23/1995, 22 y.o.   MRN: [id number]  HPI Here to check on a lesion on the right 4th finger that appeared about 2 years ago. It has been slowly getting larger. It bleeds from time to time. Otherwise he needs refills for his asthma supplies.    Review of Systems  Constitutional: Negative.   Respiratory: Negative.   Cardiovascular: Negative.   Neurological: Negative.        Objective:   Physical Exam  Constitutional: He appears well-developed and well-nourished.  Cardiovascular: Normal rate, regular rhythm, normal heart sounds and intact distal pulses.  Pulmonary/Chest: Effort normal and breath sounds normal. No respiratory distress. He has no wheezes. He has no rales.  Skin:  The right 4th finger has a pedunculated fleshy lesion which is hypervascular           Assessment & Plan:  His asthma is stable. Meds were refilled. He has a granuloma on the finger. We will refer to [facility] to remove this.  Gershon Crane, MD

## 2017-07-10 DIAGNOSIS — H5032 Intermittent alternating esotropia: Secondary | ICD-10-CM | POA: Diagnosis not present

## 2017-07-10 DIAGNOSIS — H5213 Myopia, bilateral: Secondary | ICD-10-CM | POA: Diagnosis not present

## 2017-09-26 ENCOUNTER — Ambulatory Visit (INDEPENDENT_AMBULATORY_CARE_PROVIDER_SITE_OTHER): Payer: BLUE CROSS/BLUE SHIELD | Admitting: Family Medicine

## 2017-09-26 ENCOUNTER — Encounter: Payer: Self-pay | Admitting: Family Medicine

## 2017-09-26 VITALS — BP 118/70 | HR 96 | Temp 98.8°F | Ht 69.0 in | Wt 250.6 lb

## 2017-09-26 DIAGNOSIS — L02229 Furuncle of trunk, unspecified: Secondary | ICD-10-CM | POA: Diagnosis not present

## 2017-09-26 MED ORDER — DOXYCYCLINE HYCLATE 100 MG PO CAPS: 100.0000 mg | ORAL_CAPSULE | Freq: Two times a day (BID) | ORAL | 0 refills | Status: AC

## 2017-09-26 NOTE — Progress Notes (Signed)
Subjective:    Patient ID: Caleb Kent, male    DOB: December 24, 1995, 22 y.o.   MRN: [id number]  HPI Here for one week of a painful boil on the abdomen. His last one was 4 years ago.    Review of Systems  Constitutional: Negative.   Respiratory: Negative.   Cardiovascular: Negative.        Objective:   Physical Exam  Constitutional: He appears well-developed and well-nourished.  Cardiovascular: Normal rate, regular rhythm, normal heart sounds and intact distal pulses.  Pulmonary/Chest: Effort normal and breath sounds normal.  Skin:  There is a tender boil on the RLQ           Assessment & Plan:  Boil, treat with Doxycycline for 14 days. Written out of work today and tomorrow. Gershon Crane, MD

## 2017-12-10 ENCOUNTER — Ambulatory Visit (INDEPENDENT_AMBULATORY_CARE_PROVIDER_SITE_OTHER): Payer: BLUE CROSS/BLUE SHIELD | Admitting: Family Medicine

## 2017-12-10 ENCOUNTER — Encounter: Payer: Self-pay | Admitting: Family Medicine

## 2017-12-10 ENCOUNTER — Encounter: Payer: Self-pay | Admitting: *Deleted

## 2017-12-10 VITALS — BP 130/82 | HR 73 | Temp 98.6°F | Ht 68.0 in | Wt 240.2 lb

## 2017-12-10 DIAGNOSIS — L0232 Furuncle of buttock: Secondary | ICD-10-CM | POA: Diagnosis not present

## 2017-12-10 MED ORDER — DOXYCYCLINE HYCLATE 100 MG PO CAPS: 100.0000 mg | ORAL_CAPSULE | Freq: Two times a day (BID) | ORAL | 0 refills | Status: AC

## 2017-12-10 NOTE — Progress Notes (Signed)
Subjective:    Patient ID: Caleb Kent, male    DOB: [birth year], 22 y.o.   MRN: [id number]  HPI Here for another boil on the left buttock that appeared 3 days ago. It is mildly painful. No fever. He has a hx of recurrent MRSA infections.    Review of Systems  Constitutional: Negative.   Respiratory: Negative.   Cardiovascular: Negative.        Objective:   Physical Exam  Constitutional: He is oriented to person, place, and time. He appears well-developed and well-nourished.  Cardiovascular: Normal rate, regular rhythm, normal heart sounds and intact distal pulses.  Pulmonary/Chest: Effort normal and breath sounds normal.  Neurological: He is alert and oriented to person, place, and time.  Skin:  There is a boil on the left buttock about 5 cm from the anus           Assessment & Plan:  Boil. This was lanced and a large amount of purulent material was expressed. Treat with Doxycycline. He was written out of work today. Gershon Crane, MD

## 2018-01-15 DIAGNOSIS — Z23 Encounter for immunization: Secondary | ICD-10-CM | POA: Diagnosis not present

## 2018-01-15 DIAGNOSIS — J3089 Other allergic rhinitis: Secondary | ICD-10-CM | POA: Diagnosis not present

## 2018-02-17 ENCOUNTER — Ambulatory Visit (HOSPITAL_COMMUNITY)
Admission: EM | Admit: 2018-02-17 | Discharge: 2018-02-17 | Disposition: A | Discharge status: Home / Self Care | Payer: BLUE CROSS/BLUE SHIELD | Attending: Family Medicine | Admitting: Family Medicine

## 2018-02-17 ENCOUNTER — Ambulatory Visit (INDEPENDENT_AMBULATORY_CARE_PROVIDER_SITE_OTHER): Payer: BLUE CROSS/BLUE SHIELD

## 2018-02-17 ENCOUNTER — Other Ambulatory Visit: Payer: Self-pay

## 2018-02-17 ENCOUNTER — Encounter (HOSPITAL_COMMUNITY): Payer: Self-pay | Admitting: Emergency Medicine

## 2018-02-17 DIAGNOSIS — R1013 Epigastric pain: Secondary | ICD-10-CM

## 2018-02-17 DIAGNOSIS — R1110 Vomiting, unspecified: Secondary | ICD-10-CM | POA: Diagnosis not present

## 2018-02-17 MED ORDER — ONDANSETRON 8 MG PO TBDP: 8.0000 mg | ORAL_TABLET | Freq: Three times a day (TID) | ORAL | 0 refills | Status: DC | PRN

## 2018-02-17 MED ORDER — OMEPRAZOLE 20 MG PO CPDR: 20.0000 mg | DELAYED_RELEASE_CAPSULE | Freq: Every day | ORAL | 1 refills | Status: DC

## 2018-02-17 NOTE — Discharge Instructions (Addendum)
There is some evidence of constipation on the x-ray, so try to increase clear fluids today and tomorrow.    If pain worsens, go to emergency room for further evaluation  In the meantime, start the medicine for too much acid and the nausea.  Expect symptoms to clear overnight.

## 2018-02-17 NOTE — ED Provider Notes (Signed)
[facility]    CSN: [id number] Arrival date & time: 02/17/18  1351     History   Chief Complaint Chief Complaint  Patient presents with  . Abdominal Pain    HPI Caleb Kent is a 22 y.o. male.   Abdominal pain for 3 days.  Patient reports vomiting once today.  Denies diarrhea.  Last BM was this morning  Abdominal pain is epigastric in location.  It started 3 days ago, then seemed to subside only to return this morning.  He has had no fever and no diarrhea.  Patient has a history of GERD.     Past Medical History:  Diagnosis Date  . ADHD (attention deficit hyperactivity disorder)   . Asthma   . GERD (gastroesophageal reflux disease)   . Shoulder separation, left, subsequent encounter    sees Dr. Eulah Pont   . Strabismus    sees Dr. Maple Hudson  . Tourette syndrome    resolved    Patient Active Problem List   Diagnosis Date Noted  . Labral tear of shoulder 07/02/2015  . ABDOMINAL PAIN, EPIGASTRIC 07/07/2009  . KNEE SPRAIN 05/25/2009  . TOURETTE'S DISORDER 03/05/2009  . ADHD 01/08/2008  . ALLERGIC RHINITIS 05/15/2007  . Asthma 12/26/2006    Past Surgical History:  Procedure Laterality Date  . EYE SURGERY Right   . SHOULDER ARTHROSCOPY WITH BANKART REPAIR Left 07/02/2015   Procedure: LEFT SHOULDER ARTHROSCOPY WITH BANKART REPAIR;  Surgeon: Sheral Apley, MD;  Location: [facility];  Service: Orthopedics;  Laterality: Left;       Home Medications    Prior to Admission medications   Medication Sig Start Date End Date Taking? Authorizing Provider  albuterol (PROAIR HFA) 108 (90 Base) MCG/ACT inhaler INHALE 2 PUFFS INTO THE LUNGS EVERY 4 HORUS AS NEEDED FOR WHEEZING/SHORTNESS OF BREATH 06/04/17  Yes Nelwyn Salisbury, MD  albuterol (PROVENTIL) (2.5 MG/3ML) 0.083% nebulizer solution USE 1 VIAL PER NEBULIZER EVERY 4 HOURS AS NEEDED FOR WHEEZING OR SHORTNESS OF BREATH 06/04/17  Yes Nelwyn Salisbury, MD  mometasone (NASONEX) 50 MCG/ACT nasal  spray Place 2 sprays into the nose daily. 06/04/17  Yes Nelwyn Salisbury, MD  simethicone (MYLICON) 125 MG chewable tablet Chew 125 mg by mouth every 6 (six) hours as needed for flatulence.   Yes [provider]  omeprazole (PRILOSEC) 20 MG capsule Take 1 capsule (20 mg total) by mouth daily. 02/17/18   Elvina Sidle, MD  ondansetron (ZOFRAN-ODT) 8 MG disintegrating tablet Take 1 tablet (8 mg total) by mouth every 8 (eight) hours as needed for nausea. 02/17/18   Elvina Sidle, MD    Family History History reviewed. No pertinent family history.  Social History Social History   Tobacco Use  . Smoking status: Current Every Day Smoker    Packs/day: 0.25    Types: Cigarettes  . Smokeless tobacco: Never Used  Substance Use Topics  . Alcohol use: No    Alcohol/week: 0.0 standard drinks  . Drug use: No     Allergies   Patient has no known allergies.   Review of Systems Review of Systems   Physical Exam Triage Vital Signs ED Triage Vitals [02/17/18 1427]  Enc Vitals Group     BP      Pulse      Resp      Temp      Temp src      SpO2      Weight  Height      Head Circumference      Peak Flow      Pain Score 9     Pain Loc      Pain Edu?      Excl. in GC?    No data found.  Updated Vital Signs BP 132/80 (BP Location: Right Arm) Comment: large cuff  Pulse 63   Temp 98.1 F (36.7 C) (Oral)   Resp 18   SpO2 98%    Physical Exam  Constitutional: He is oriented to person, place, and time. He appears well-developed and well-nourished.  Exam hindered somewhat by obesity  HENT:  Head: Normocephalic.  Eyes: Pupils are equal, round, and reactive to light.  Patient has a right Esotropia  Cardiovascular: Normal rate, regular rhythm and normal heart sounds.  Pulmonary/Chest: Effort normal and breath sounds normal.  Abdominal: Soft. Normal appearance and bowel sounds are normal. There is tenderness in the right upper quadrant and epigastric area. There is  no rigidity, no rebound, no guarding and no CVA tenderness.  Neurological: He is alert and oriented to person, place, and time.  Skin: Skin is warm.  Psychiatric: He has a normal mood and affect.  Nursing note and vitals reviewed.    UC Treatments / Results  Labs (all labs ordered are listed, but only abnormal results are displayed) Labs Reviewed - No data to display  EKG None  Radiology No results found.  Procedures Procedures (including critical care time)  Medications Ordered in UC Medications - No data to display  Initial Impression / Assessment and Plan / UC Course  I have reviewed the triage vital signs and the nursing notes.  Pertinent labs & imaging results that were available during my care of the patient were reviewed by me and considered in my medical decision making (see chart for details).     Final Clinical Impressions(s) / UC Diagnoses   Final diagnoses:  Abdominal pain, epigastric     Discharge Instructions     There is some evidence of constipation on the x-ray, so try to increase clear fluids today and tomorrow.    If pain worsens, go to emergency room for further evaluation  In the meantime, start the medicine for too much acid and the nausea.  Expect symptoms to clear overnight.    ED Prescriptions    Medication Sig Dispense Auth. Provider   omeprazole (PRILOSEC) 20 MG capsule Take 1 capsule (20 mg total) by mouth daily. 10 capsule Elvina SidleLauenstein, Ardis Fullwood, MD   ondansetron (ZOFRAN-ODT) 8 MG disintegrating tablet Take 1 tablet (8 mg total) by mouth every 8 (eight) hours as needed for nausea. 12 tablet Elvina SidleLauenstein, Johnhenry Tippin, MD     Controlled Substance Prescriptions [place] Controlled Substance Registry consulted? Not Applicable   Elvina SidleLauenstein, Shelita Steptoe, MD 02/17/18 1506

## 2018-02-17 NOTE — ED Triage Notes (Signed)
Abdominal pain for 3 days.  Patient reports vomiting.  Denies diarrhea

## 2018-05-16 ENCOUNTER — Ambulatory Visit (INDEPENDENT_AMBULATORY_CARE_PROVIDER_SITE_OTHER): Payer: BLUE CROSS/BLUE SHIELD | Admitting: Family Medicine

## 2018-05-16 ENCOUNTER — Encounter: Payer: Self-pay | Admitting: Family Medicine

## 2018-05-16 VITALS — BP 130/82 | HR 86 | Temp 98.2°F | Wt 227.1 lb

## 2018-05-16 DIAGNOSIS — J019 Acute sinusitis, unspecified: Secondary | ICD-10-CM | POA: Diagnosis not present

## 2018-05-16 MED ORDER — AZITHROMYCIN 250 MG PO TABS: ORAL_TABLET | ORAL | 0 refills | Status: DC

## 2018-05-16 NOTE — Progress Notes (Signed)
Subjective:    Patient ID: BERISH RIVENBURG, male    DOB: [birth year], 23 y.o.   MRN: [id number]  HPI Here for 3 days of sinus pressure, PND, and coughing up yellow sputum. No fever.    Review of Systems  Constitutional: Negative.   HENT: Positive for congestion, postnasal drip, sinus pressure and sinus pain. Negative for sore throat.   Eyes: Negative.   Respiratory: Positive for cough.        Objective:   Physical Exam Constitutional:      Appearance: Normal appearance.  HENT:     Right Ear: Tympanic membrane and ear canal normal.     Left Ear: Tympanic membrane and ear canal normal.     Nose: Nose normal.     Mouth/Throat:     Pharynx: Oropharynx is clear.  Eyes:     Conjunctiva/sclera: Conjunctivae normal.  Pulmonary:     Effort: Pulmonary effort is normal. No respiratory distress.     Breath sounds: Normal breath sounds. No stridor. No wheezing, rhonchi or rales.  Lymphadenopathy:     Cervical: No cervical adenopathy.  Neurological:     Mental Status: He is alert.           Assessment & Plan:  Sinusitis, treat with a Zpack and Mucinex. Written out of work today.  Gershon Crane, MD

## 2018-07-02 ENCOUNTER — Ambulatory Visit (HOSPITAL_COMMUNITY)
Admission: EM | Admit: 2018-07-02 | Discharge: 2018-07-02 | Disposition: A | Discharge status: Home / Self Care | Payer: BLUE CROSS/BLUE SHIELD | Attending: Family Medicine | Admitting: Family Medicine

## 2018-07-02 ENCOUNTER — Encounter: Payer: Self-pay | Admitting: *Deleted

## 2018-07-02 ENCOUNTER — Other Ambulatory Visit: Payer: Self-pay

## 2018-07-02 ENCOUNTER — Encounter (HOSPITAL_COMMUNITY): Payer: Self-pay

## 2018-07-02 ENCOUNTER — Telehealth: Payer: Self-pay | Admitting: Family Medicine

## 2018-07-02 DIAGNOSIS — H1032 Unspecified acute conjunctivitis, left eye: Secondary | ICD-10-CM

## 2018-07-02 MED ORDER — CETIRIZINE HCL 10 MG PO TABS: 10.0000 mg | ORAL_TABLET | Freq: Every day | ORAL | 0 refills | Status: DC

## 2018-07-02 MED ORDER — POLYMYXIN B-TRIMETHOPRIM 10000-0.1 UNIT/ML-% OP SOLN: 1.0000 [drp] | OPHTHALMIC | 0 refills | Status: DC

## 2018-07-02 NOTE — ED Triage Notes (Signed)
Pt presents with left eye irritation; drainage and crustiness since last night.

## 2018-07-02 NOTE — Telephone Encounter (Signed)
Pt called noting his eye is red, itching, and watering. This started last night. Advised to send msg for provider to review. Please f/u with pt.

## 2018-07-02 NOTE — Telephone Encounter (Signed)
Called and spoke with pt and he stated that he went to the urgent care and was seen this morning.  No appt needed.

## 2018-07-02 NOTE — ED Provider Notes (Signed)
[facility]    CSN: [id number] Arrival date & time: 07/02/18  4158     History   Chief Complaint Chief Complaint  Patient presents with  . Conjunctivitis    HPI Caleb Kent is a 23 y.o. male.   Pt is a 23 year old male that presents with left eye redness, itching, drainage. This started yesterday. His girlfriend was dx with conjunctivitis. Symptoms have been constant. He has not done anything to treat the symptoms.  Denies any associated fevers, chills, cough, congestion, rhinorrhea, sneezing. He does have a hx of allergies.   ROS per HPI      Past Medical History:  Diagnosis Date  . ADHD (attention deficit hyperactivity disorder)   . Asthma   . GERD (gastroesophageal reflux disease)   . Shoulder separation, left, subsequent encounter    sees Dr. Eulah Pont   . Strabismus    sees Dr. Maple Hudson  . Tourette syndrome    resolved    Patient Active Problem List   Diagnosis Date Noted  . Labral tear of shoulder 07/02/2015  . ABDOMINAL PAIN, EPIGASTRIC 07/07/2009  . KNEE SPRAIN 05/25/2009  . TOURETTE'S DISORDER 03/05/2009  . ADHD 01/08/2008  . ALLERGIC RHINITIS 05/15/2007  . Asthma 12/26/2006    Past Surgical History:  Procedure Laterality Date  . EYE SURGERY Right   . SHOULDER ARTHROSCOPY WITH BANKART REPAIR Left 07/02/2015   Procedure: LEFT SHOULDER ARTHROSCOPY WITH BANKART REPAIR;  Surgeon: Sheral Apley, MD;  Location: [facility];  Service: Orthopedics;  Laterality: Left;       Home Medications    Prior to Admission medications   Medication Sig Start Date End Date Taking? Authorizing Provider  albuterol (PROAIR HFA) 108 (90 Base) MCG/ACT inhaler INHALE 2 PUFFS INTO THE LUNGS EVERY 4 HORUS AS NEEDED FOR WHEEZING/SHORTNESS OF BREATH 06/04/17   Nelwyn Salisbury, MD  albuterol (PROVENTIL) (2.5 MG/3ML) 0.083% nebulizer solution USE 1 VIAL PER NEBULIZER EVERY 4 HOURS AS NEEDED FOR WHEEZING OR SHORTNESS OF BREATH 06/04/17   Nelwyn Salisbury, MD  cetirizine (ZYRTEC) 10 MG tablet Take 1 tablet (10 mg total) by mouth daily. 07/02/18   Caton Popowski, Gloris Manchester A, NP  mometasone (NASONEX) 50 MCG/ACT nasal spray Place 2 sprays into the nose daily. 06/04/17   Nelwyn Salisbury, MD  omeprazole (PRILOSEC) 20 MG capsule Take 1 capsule (20 mg total) by mouth daily. 02/17/18   Elvina Sidle, MD  ondansetron (ZOFRAN-ODT) 8 MG disintegrating tablet Take 1 tablet (8 mg total) by mouth every 8 (eight) hours as needed for nausea. 02/17/18   Elvina Sidle, MD  simethicone (MYLICON) 125 MG chewable tablet Chew 125 mg by mouth every 6 (six) hours as needed for flatulence.    [provider]  trimethoprim-polymyxin b (POLYTRIM) ophthalmic solution Place 1 drop into the left eye every 4 (four) hours. 07/02/18   Janace Aris, NP    Family History History reviewed. No pertinent family history.  Social History Social History   Tobacco Use  . Smoking status: Current Every Day Smoker    Packs/day: 0.25    Types: Cigarettes  . Smokeless tobacco: Never Used  Substance Use Topics  . Alcohol use: No    Alcohol/week: 0.0 standard drinks  . Drug use: No     Allergies   Patient has no known allergies.   Review of Systems Review of Systems   Physical Exam Triage Vital Signs ED Triage Vitals  Enc Vitals Group  BP 07/02/18 0952 (!) 158/83     Pulse Rate 07/02/18 0952 61     Resp 07/02/18 0952 20     Temp 07/02/18 0952 (!) 97.5 F (36.4 C)     Temp Source 07/02/18 0952 Oral     SpO2 07/02/18 0952 98 %     Weight --      Height --      Head Circumference --      Peak Flow --      Pain Score 07/02/18 0953 0     Pain Loc --      Pain Edu? --      Excl. in GC? --    No data found.  Updated Vital Signs BP (!) 158/83 (BP Location: Left Arm)   Pulse 61   Temp (!) 97.5 F (36.4 C) (Oral)   Resp 20   SpO2 98%   Visual Acuity Right Eye Distance:   Left Eye Distance:   Bilateral Distance:    Right Eye Near:   Left Eye Near:     Bilateral Near:     Physical Exam Eyes:     General: Lids are normal. Lids are everted, no foreign bodies appreciated.        Left eye: Discharge present.No foreign body or hordeolum.     Conjunctiva/sclera:     Left eye: Left conjunctiva is injected. Exudate present.      UC Treatments / Results  Labs (all labs ordered are listed, but only abnormal results are displayed) Labs Reviewed - No data to display  EKG None  Radiology No results found.  Procedures Procedures (including critical care time)  Medications Ordered in UC Medications - No data to display  Initial Impression / Assessment and Plan / UC Course  I have reviewed the triage vital signs and the nursing notes.  Pertinent labs & imaging results that were available during my care of the patient were reviewed by me and considered in my medical decision making (see chart for details).     Symptoms are consistent with conjunctivitis Viral versus bacterial.  Patient was exposed to bacterial conjunctivitis We will treat with Polytrim Also recommended taking daily Zyrtec for allergy symptoms Follow up as needed for continued or worsening symptoms Final Clinical Impressions(s) / UC Diagnoses   Final diagnoses:  Acute conjunctivitis of left eye, unspecified acute conjunctivitis type     Discharge Instructions     We will go ahead and treat you for pink eye based on symptoms and exposure.  Use the eye drops as directed.  Take the zyrtec daily for allergies.  Follow up as needed for continued or worsening symptoms     ED Prescriptions    Medication Sig Dispense Auth. Provider   trimethoprim-polymyxin b (POLYTRIM) ophthalmic solution Place 1 drop into the left eye every 4 (four) hours. 10 mL Pearley Millington A, NP   cetirizine (ZYRTEC) 10 MG tablet Take 1 tablet (10 mg total) by mouth daily. 30 tablet Dahlia Byes A, NP     Controlled Substance Prescriptions [place] Controlled Substance Registry consulted? Not  Applicable   Janace Aris, NP 07/02/18 1009

## 2018-07-02 NOTE — Telephone Encounter (Signed)
This encounter was created in error - please disregard.

## 2018-07-02 NOTE — Discharge Instructions (Signed)
We will go ahead and treat you for pink eye based on symptoms and exposure.  Use the eye drops as directed.  Take the zyrtec daily for allergies.  Follow up as needed for continued or worsening symptoms

## 2018-12-26 IMAGING — DX DG ABDOMEN 1V
2 series · 2 of 2 positions shown · non-contrast
Comparison: None.

CLINICAL DATA: Epigastric pain, vomiting

EXAM:
ABDOMEN - 1 VIEW

[abdomen kub (1 of 2)]
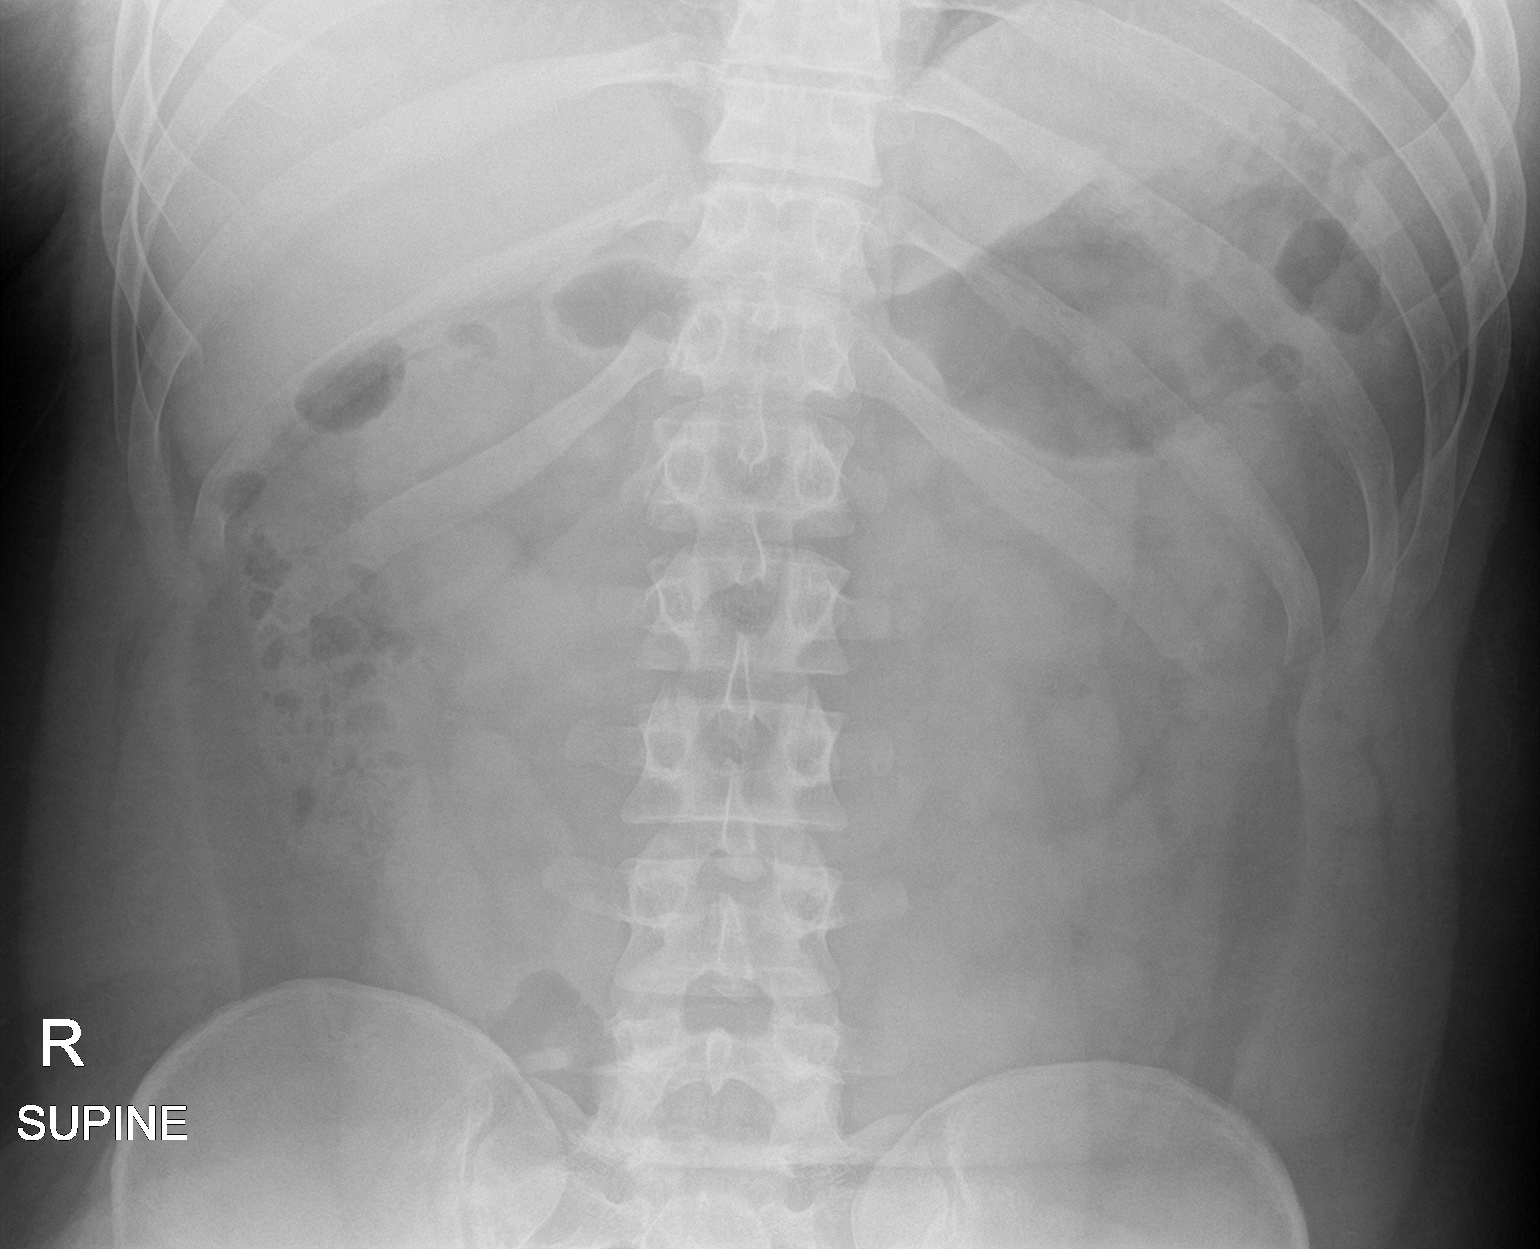

[abdomen kub (2 of 2)]
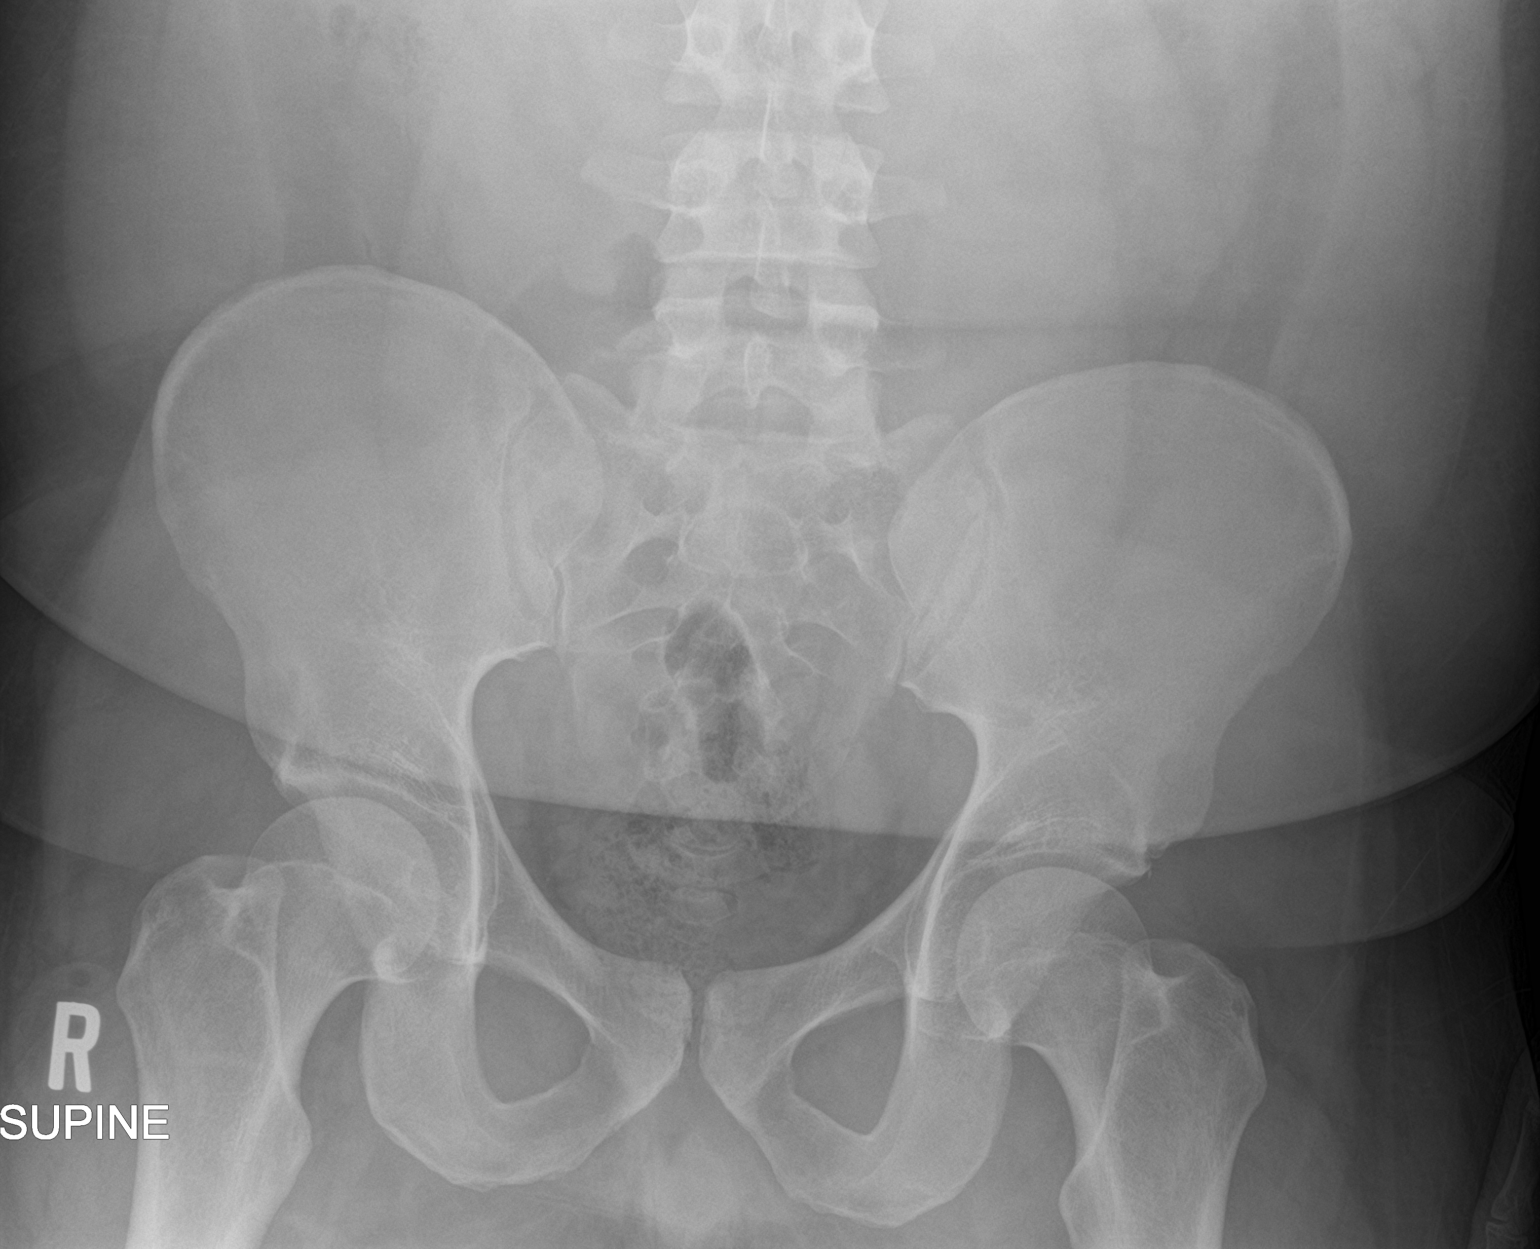

[2 of 2 positions shown; findings below may reference images not displayed]

FINDINGS: Nonobstructive bowel gas pattern.

Visualized osseous structures are within normal limits.
IMPRESSION: Unremarkable abdominal radiograph.

## 2019-06-30 ENCOUNTER — Ambulatory Visit: Payer: BC Managed Care – PPO | Attending: Internal Medicine

## 2019-06-30 DIAGNOSIS — Z23 Encounter for immunization: Secondary | ICD-10-CM

## 2019-06-30 NOTE — Progress Notes (Signed)
Covid-19 Vaccination Clinic  Name:  ARSHDEEP FRUCHEY    MRN: [id number] DOB: 1995-06-21  06/30/2019  Mr. Schaper was observed post Covid-19 immunization for 15 minutes without incident. He was provided with Vaccine Information Sheet and instruction to access the V-Safe system.   Mr. Ridder was instructed to call 911 with any severe reactions post vaccine: Marland Kitchen Difficulty breathing  . Swelling of face and throat  . A fast heartbeat  . A bad rash all over body  . Dizziness and weakness   Immunizations Administered    Name Date Dose VIS Date Route   Pfizer COVID-19 Vaccine 06/30/2019  4:44 PM 0.3 mL 03/14/2019 Intramuscular   Manufacturer: [employer], [employer]   Lot: [id number]   NDC: [address]-4

## 2019-07-20 DIAGNOSIS — Z5321 Procedure and treatment not carried out due to patient leaving prior to being seen by health care provider: Secondary | ICD-10-CM | POA: Diagnosis not present

## 2019-07-20 DIAGNOSIS — Z20822 Contact with and (suspected) exposure to covid-19: Secondary | ICD-10-CM | POA: Diagnosis not present

## 2019-07-20 DIAGNOSIS — J45909 Unspecified asthma, uncomplicated: Secondary | ICD-10-CM | POA: Diagnosis not present

## 2019-07-21 ENCOUNTER — Ambulatory Visit: Payer: BC Managed Care – PPO | Attending: Internal Medicine

## 2019-07-21 DIAGNOSIS — Z23 Encounter for immunization: Secondary | ICD-10-CM

## 2019-07-21 DIAGNOSIS — J209 Acute bronchitis, unspecified: Secondary | ICD-10-CM | POA: Diagnosis not present

## 2019-07-21 NOTE — Progress Notes (Signed)
Covid-19 Vaccination Clinic  Name:  Caleb Kent    MRN: [id number] DOB: February 12, 1996  07/21/2019  [name] was observed post Covid-19 immunization for 15 minutes without incident. He was provided with Vaccine Information Sheet and instruction to access the V-Safe system.   [name] was instructed to call 911 with any severe reactions post vaccine: Marland Kitchen Difficulty breathing  . Swelling of face and throat  . A fast heartbeat  . A bad rash all over body  . Dizziness and weakness   Immunizations Administered    Name Date Dose VIS Date Route   Pfizer COVID-19 Vaccine 07/21/2019  5:00 PM 0.3 mL 05/28/2018 Intramuscular   Manufacturer: [employer], [employer]   Lot: [id number]   NDC: [address]-2

## 2019-07-28 ENCOUNTER — Ambulatory Visit: Payer: BC Managed Care – PPO

## 2019-11-27 DIAGNOSIS — T63441A Toxic effect of venom of bees, accidental (unintentional), initial encounter: Secondary | ICD-10-CM | POA: Diagnosis not present

## 2019-11-27 DIAGNOSIS — T7840XA Allergy, unspecified, initial encounter: Secondary | ICD-10-CM | POA: Diagnosis not present

## 2022-01-18 ENCOUNTER — Emergency Department: Payer: Commercial Managed Care - HMO

## 2022-01-18 ENCOUNTER — Other Ambulatory Visit: Payer: Self-pay

## 2022-01-18 ENCOUNTER — Emergency Department
Admission: EM | Admit: 2022-01-18 | Discharge: 2022-01-18 | Disposition: A | Discharge status: Home / Self Care | Payer: Commercial Managed Care - HMO | Attending: Emergency Medicine | Admitting: Emergency Medicine

## 2022-01-18 DIAGNOSIS — S62300A Unspecified fracture of second metacarpal bone, right hand, initial encounter for closed fracture: Secondary | ICD-10-CM | POA: Diagnosis not present

## 2022-01-18 DIAGNOSIS — Y92410 Unspecified street and highway as the place of occurrence of the external cause: Secondary | ICD-10-CM | POA: Diagnosis not present

## 2022-01-18 DIAGNOSIS — S6991XA Unspecified injury of right wrist, hand and finger(s), initial encounter: Secondary | ICD-10-CM | POA: Diagnosis present

## 2022-01-18 DIAGNOSIS — S6291XA Unspecified fracture of right wrist and hand, initial encounter for closed fracture: Secondary | ICD-10-CM

## 2022-01-18 MED ORDER — FLUORESCEIN SODIUM 1 MG OP STRP
1.0000 | ORAL_STRIP | Freq: Once | OPHTHALMIC | Status: AC
  Administered 2022-01-18: 1 via OPHTHALMIC
  Filled 2022-01-18: qty 1

## 2022-01-18 MED ORDER — OXYCODONE-ACETAMINOPHEN 5-325 MG PO TABS
1.0000 | ORAL_TABLET | ORAL | Status: DC | PRN
  Administered 2022-01-18: 1 via ORAL
  Filled 2022-01-18: qty 1

## 2022-01-18 MED ORDER — OXYCODONE-ACETAMINOPHEN 5-325 MG PO TABS: 1.0000 | ORAL_TABLET | Freq: Three times a day (TID) | ORAL | 0 refills | Status: AC | PRN
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
## 2022-01-18 NOTE — Discharge Instructions (Signed)
Your exam and XR show a fracture to the index finger at the base knuckle. You will be placed in a splint. Keep the splint clean and dry. Take the prescription pain medicine as needed and tylenol for non-drowsy pain relief. Apply ice over the splint to reduce swelling.
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
## 2022-01-24 ENCOUNTER — Other Ambulatory Visit: Payer: Self-pay

## 2022-01-24 ENCOUNTER — Encounter (HOSPITAL_COMMUNITY): Payer: Self-pay | Admitting: Orthopedic Surgery

## 2022-01-24 NOTE — Progress Notes (Addendum)
Mr. Caleb Kent denies chest pain or shortness of breath.  Patient denies having any s/s of Covid in his household, also denies any known exposure to Covid.   [name] reports that he no longer has ADHD or tourettes. Patient's PCP is Dr. Delma Freeze.

## 2022-01-25 ENCOUNTER — Encounter (HOSPITAL_COMMUNITY)
Admission: RE | Disposition: A | Discharge status: Home / Self Care | Payer: Self-pay | Source: Home / Self Care | Attending: Orthopedic Surgery

## 2022-01-25 ENCOUNTER — Ambulatory Visit (HOSPITAL_COMMUNITY): Payer: Commercial Managed Care - HMO

## 2022-01-25 ENCOUNTER — Ambulatory Visit (HOSPITAL_COMMUNITY)
Admission: RE | Admit: 2022-01-25 | Discharge: 2022-01-25 | Disposition: A | Discharge status: Home / Self Care | Payer: Commercial Managed Care - HMO | Attending: Orthopedic Surgery | Admitting: Orthopedic Surgery

## 2022-01-25 ENCOUNTER — Other Ambulatory Visit: Payer: Self-pay

## 2022-01-25 ENCOUNTER — Ambulatory Visit (HOSPITAL_COMMUNITY): Payer: Commercial Managed Care - HMO | Admitting: Anesthesiology

## 2022-01-25 ENCOUNTER — Ambulatory Visit (HOSPITAL_BASED_OUTPATIENT_CLINIC_OR_DEPARTMENT_OTHER): Payer: Commercial Managed Care - HMO | Admitting: Anesthesiology

## 2022-01-25 DIAGNOSIS — S62330A Displaced fracture of neck of second metacarpal bone, right hand, initial encounter for closed fracture: Secondary | ICD-10-CM

## 2022-01-25 DIAGNOSIS — J45909 Unspecified asthma, uncomplicated: Secondary | ICD-10-CM | POA: Diagnosis not present

## 2022-01-25 DIAGNOSIS — F17210 Nicotine dependence, cigarettes, uncomplicated: Secondary | ICD-10-CM | POA: Diagnosis not present

## 2022-01-25 DIAGNOSIS — S62201A Unspecified fracture of first metacarpal bone, right hand, initial encounter for closed fracture: Secondary | ICD-10-CM | POA: Insufficient documentation

## 2022-01-25 HISTORY — PX: OPEN REDUCTION INTERNAL FIXATION (ORIF) METACARPAL: SHX6234

## 2022-01-25 LAB — CBC
HCT: 48.8 % (ref 39.0–52.0)
Hemoglobin: 16.7 g/dL (ref 13.0–17.0)
MCH: 28 pg (ref 26.0–34.0)
MCHC: 34.2 g/dL (ref 30.0–36.0)
MCV: 81.9 fL (ref 80.0–100.0)
Platelets: 191 10*3/uL (ref 150–400)
RBC: 5.96 MIL/uL — ABNORMAL HIGH (ref 4.22–5.81)
RDW: 12.8 % (ref 11.5–15.5)
WBC: 12.6 10*3/uL — ABNORMAL HIGH (ref 4.0–10.5)
nRBC: 0 % (ref 0.0–0.2)

## 2022-01-25 SURGERY — OPEN REDUCTION INTERNAL FIXATION (ORIF) METACARPAL
Anesthesia: Monitor Anesthesia Care | Site: Finger | Laterality: Right

## 2022-01-25 MED ORDER — ALBUTEROL SULFATE (2.5 MG/3ML) 0.083% IN NEBU
2.5000 mg | INHALATION_SOLUTION | Freq: Four times a day (QID) | RESPIRATORY_TRACT | Status: DC | PRN
  Administered 2022-01-25: 2.5 mg via RESPIRATORY_TRACT

## 2022-01-25 MED ORDER — AMISULPRIDE (ANTIEMETIC) 5 MG/2ML IV SOLN: 10.0000 mg | Freq: Once | INTRAVENOUS | Status: DC | PRN

## 2022-01-25 MED ORDER — PROPOFOL 500 MG/50ML IV EMUL
INTRAVENOUS | Status: DC | PRN
  Administered 2022-01-25: 100 ug/kg/min via INTRAVENOUS

## 2022-01-25 MED ORDER — KETAMINE HCL 10 MG/ML IJ SOLN
INTRAMUSCULAR | Status: DC | PRN
  Administered 2022-01-25: 30 mg via INTRAVENOUS

## 2022-01-25 MED ORDER — OXYCODONE HCL 5 MG/5ML PO SOLN: 5.0000 mg | Freq: Once | ORAL | Status: DC | PRN

## 2022-01-25 MED ORDER — GLYCOPYRROLATE PF 0.2 MG/ML IJ SOSY
PREFILLED_SYRINGE | INTRAMUSCULAR | Status: DC | PRN
  Administered 2022-01-25: .2 mg via INTRAVENOUS

## 2022-01-25 MED ORDER — OXYCODONE HCL 5 MG PO TABS: 5.0000 mg | ORAL_TABLET | Freq: Once | ORAL | Status: DC | PRN

## 2022-01-25 MED ORDER — CEFAZOLIN SODIUM-DEXTROSE 2-4 GM/100ML-% IV SOLN
2.0000 g | INTRAVENOUS | Status: AC
  Administered 2022-01-25: 2 g via INTRAVENOUS
  Administered 2022-01-25: 1 g via INTRAVENOUS
  Filled 2022-01-25: qty 100

## 2022-01-25 MED ORDER — MIDAZOLAM HCL 2 MG/2ML IJ SOLN
INTRAMUSCULAR | Status: AC
  Administered 2022-01-25: 2 mg via INTRAVENOUS
  Filled 2022-01-25: qty 2

## 2022-01-25 MED ORDER — MIDAZOLAM HCL 2 MG/2ML IJ SOLN
INTRAMUSCULAR | Status: AC
  Filled 2022-01-25: qty 2

## 2022-01-25 MED ORDER — LACTATED RINGERS IV SOLN: INTRAVENOUS | Status: DC

## 2022-01-25 MED ORDER — ACETAMINOPHEN 500 MG PO TABS
1000.0000 mg | ORAL_TABLET | Freq: Once | ORAL | Status: AC
  Administered 2022-01-25: 1000 mg via ORAL
  Filled 2022-01-25: qty 2

## 2022-01-25 MED ORDER — FENTANYL CITRATE (PF) 100 MCG/2ML IJ SOLN: 100.0000 ug | Freq: Once | INTRAMUSCULAR | Status: AC

## 2022-01-25 MED ORDER — MIDAZOLAM HCL 2 MG/2ML IJ SOLN
INTRAMUSCULAR | Status: DC | PRN
  Administered 2022-01-25: 2 mg via INTRAVENOUS

## 2022-01-25 MED ORDER — DEXMEDETOMIDINE HCL IN NACL 80 MCG/20ML IV SOLN
INTRAVENOUS | Status: DC | PRN
  Administered 2022-01-25: 4 ug via BUCCAL
  Administered 2022-01-25 (×2): 8 ug via BUCCAL

## 2022-01-25 MED ORDER — KETAMINE HCL 50 MG/5ML IJ SOSY
PREFILLED_SYRINGE | INTRAMUSCULAR | Status: AC
  Filled 2022-01-25: qty 5

## 2022-01-25 MED ORDER — ORAL CARE MOUTH RINSE: 15.0000 mL | Freq: Once | OROMUCOSAL | Status: AC

## 2022-01-25 MED ORDER — FENTANYL CITRATE (PF) 100 MCG/2ML IJ SOLN: 25.0000 ug | INTRAMUSCULAR | Status: DC | PRN

## 2022-01-25 MED ORDER — PHENYLEPHRINE 80 MCG/ML (10ML) SYRINGE FOR IV PUSH (FOR BLOOD PRESSURE SUPPORT)
PREFILLED_SYRINGE | INTRAVENOUS | Status: DC | PRN
  Administered 2022-01-25 (×3): 80 ug via INTRAVENOUS

## 2022-01-25 MED ORDER — MIDAZOLAM HCL 2 MG/2ML IJ SOLN: 2.0000 mg | Freq: Once | INTRAMUSCULAR | Status: AC

## 2022-01-25 MED ORDER — CHLORHEXIDINE GLUCONATE 0.12 % MT SOLN
15.0000 mL | Freq: Once | OROMUCOSAL | Status: AC
  Administered 2022-01-25: 15 mL via OROMUCOSAL
  Filled 2022-01-25: qty 15

## 2022-01-25 MED ORDER — BUPIVACAINE-EPINEPHRINE (PF) 0.5% -1:200000 IJ SOLN
INTRAMUSCULAR | Status: DC | PRN
  Administered 2022-01-25: 30 mL via PERINEURAL

## 2022-01-25 MED ORDER — 0.9 % SODIUM CHLORIDE (POUR BTL) OPTIME
TOPICAL | Status: DC | PRN
  Administered 2022-01-25: 1000 mL

## 2022-01-25 MED ORDER — ALBUTEROL SULFATE (2.5 MG/3ML) 0.083% IN NEBU
INHALATION_SOLUTION | RESPIRATORY_TRACT | Status: AC
  Filled 2022-01-25: qty 3

## 2022-01-25 MED ORDER — DEXMEDETOMIDINE HCL IN NACL 80 MCG/20ML IV SOLN
INTRAVENOUS | Status: AC
  Filled 2022-01-25: qty 20

## 2022-01-25 MED ORDER — FENTANYL CITRATE (PF) 100 MCG/2ML IJ SOLN
INTRAMUSCULAR | Status: AC
  Administered 2022-01-25: 100 ug via INTRAVENOUS
  Filled 2022-01-25: qty 2

## 2022-01-25 MED ORDER — CLONIDINE HCL (ANALGESIA) 100 MCG/ML EP SOLN
EPIDURAL | Status: DC | PRN
  Administered 2022-01-25: 100 ug

## 2022-01-25 SURGICAL SUPPLY — 62 items
BAG COUNTER SPONGE SURGICOUNT (BAG) ×1 IMPLANT
BIT DRILL 1.8 CANN MAX VPC (BIT) IMPLANT
BLADE CLIPPER SURG (BLADE) IMPLANT
BNDG ELASTIC 2X5.8 VLCR STR LF (GAUZE/BANDAGES/DRESSINGS) IMPLANT
BNDG ELASTIC 3X5.8 VLCR STR LF (GAUZE/BANDAGES/DRESSINGS) ×1 IMPLANT
BNDG ELASTIC 4X5.8 VLCR STR LF (GAUZE/BANDAGES/DRESSINGS) ×1 IMPLANT
BNDG ESMARK 4X9 LF (GAUZE/BANDAGES/DRESSINGS) ×1 IMPLANT
BNDG GAUZE DERMACEA FLUFF 4 (GAUZE/BANDAGES/DRESSINGS) ×1 IMPLANT
CORD BIPOLAR FORCEPS 12FT (ELECTRODE) ×1 IMPLANT
COVER SURGICAL LIGHT HANDLE (MISCELLANEOUS) ×1 IMPLANT
CUFF TOURN SGL QUICK 18X4 (TOURNIQUET CUFF) ×1 IMPLANT
CUFF TOURN SGL QUICK 24 (TOURNIQUET CUFF) ×1
CUFF TRNQT CYL 24X4X16.5-23 (TOURNIQUET CUFF) IMPLANT
DRAIN TLS ROUND 10FR (DRAIN) IMPLANT
DRAPE OEC MINIVIEW 54X84 (DRAPES) ×1 IMPLANT
DRAPE SURG 17X11 SM STRL (DRAPES) ×1 IMPLANT
DRAPE SURG 17X23 STRL (DRAPES) IMPLANT
DRIVER BIT HEX CANN 1.5 (ORTHOPEDIC DISPOSABLE SUPPLIES) IMPLANT
DRSG ADAPTIC 3X8 NADH LF (GAUZE/BANDAGES/DRESSINGS) ×1 IMPLANT
GAUZE 4X4 16PLY ~~LOC~~+RFID DBL (SPONGE) IMPLANT
GAUZE SPONGE 4X4 12PLY STRL (GAUZE/BANDAGES/DRESSINGS) ×1 IMPLANT
GLOVE BIOGEL PI IND STRL 8.5 (GLOVE) ×1 IMPLANT
GLOVE SURG ORTHO 8.0 STRL STRW (GLOVE) ×1 IMPLANT
GOWN STRL REUS W/ TWL LRG LVL3 (GOWN DISPOSABLE) ×3 IMPLANT
GOWN STRL REUS W/ TWL XL LVL3 (GOWN DISPOSABLE) ×1 IMPLANT
GOWN STRL REUS W/TWL LRG LVL3 (GOWN DISPOSABLE) ×2
GOWN STRL REUS W/TWL XL LVL3 (GOWN DISPOSABLE) ×2
K-WIRE COCR 0.9X95 (WIRE) ×4
KIT BASIN OR (CUSTOM PROCEDURE TRAY) ×1 IMPLANT
KIT TURNOVER KIT B (KITS) ×1 IMPLANT
KWIRE COCR 0.9X95 (WIRE) IMPLANT
MANIFOLD NEPTUNE II (INSTRUMENTS) ×1 IMPLANT
NDL HYPO 25X1 1.5 SAFETY (NEEDLE) ×1 IMPLANT
NEEDLE HYPO 25X1 1.5 SAFETY (NEEDLE) IMPLANT
NS IRRIG 1000ML POUR BTL (IV SOLUTION) ×1 IMPLANT
PACK ORTHO EXTREMITY (CUSTOM PROCEDURE TRAY) ×1 IMPLANT
PAD ARMBOARD 7.5X6 YLW CONV (MISCELLANEOUS) ×2 IMPLANT
PAD CAST 3X4 CTTN HI CHSV (CAST SUPPLIES) IMPLANT
PAD CAST 4YDX4 CTTN HI CHSV (CAST SUPPLIES) ×1 IMPLANT
PADDING CAST COTTON 3X4 STRL (CAST SUPPLIES) ×1
PADDING CAST COTTON 4X4 STRL (CAST SUPPLIES)
PADDING UNDERCAST 2X4 STRL (CAST SUPPLIES) IMPLANT
SCREW VPC 2.5X14MM (Screw) IMPLANT
SCREW VPC 2.5X16MM (Screw) IMPLANT
SLING ARM FOAM STRAP XLG (SOFTGOODS) IMPLANT
SOAP 2 % CHG 4 OZ (WOUND CARE) ×1 IMPLANT
SPLINT FIBERGLASS 3X12 (CAST SUPPLIES) IMPLANT
STRIP CLOSURE SKIN 1/2X4 (GAUZE/BANDAGES/DRESSINGS) IMPLANT
SUT ETHIBOND 4 0 TF (SUTURE) IMPLANT
SUT ETHILON 4 0 PS 2 18 (SUTURE) IMPLANT
SUT MNCRL AB 3-0 PS2 18 (SUTURE) IMPLANT
SUT MNCRL AB 4-0 PS2 18 (SUTURE) IMPLANT
SUT PROLENE 4 0 PS 2 18 (SUTURE) IMPLANT
SUT VIC AB 2-0 FS1 27 (SUTURE) IMPLANT
SUT VICRYL 4-0 PS2 18IN ABS (SUTURE) IMPLANT
SYR CONTROL 10ML LL (SYRINGE) IMPLANT
SYSTEM CHEST DRAIN TLS 7FR (DRAIN) IMPLANT
TOWEL GREEN STERILE (TOWEL DISPOSABLE) ×1 IMPLANT
TOWEL GREEN STERILE FF (TOWEL DISPOSABLE) ×1 IMPLANT
TUBE CONNECTING 12X1/4 (SUCTIONS) ×1 IMPLANT
WATER STERILE IRR 1000ML POUR (IV SOLUTION) ×1 IMPLANT
YANKAUER SUCT BULB TIP NO VENT (SUCTIONS) IMPLANT
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
## 2022-01-25 NOTE — Anesthesia Postprocedure Evaluation (Signed)
Anesthesia Post Note  Patient: Caleb Kent  Procedure(s) Performed: RIGHT INDEX FINGER METACARPAL HEAD OPEN REDUCTION INTERNAL FIXATION (Right: Finger)     Patient location during evaluation: PACU Anesthesia Type: Regional Level of consciousness: awake and alert Pain management: pain level controlled Vital Signs Assessment: post-procedure vital signs reviewed and stable Respiratory status: spontaneous breathing, nonlabored ventilation and respiratory function stable Cardiovascular status: blood pressure returned to baseline Postop Assessment: no apparent nausea or vomiting Anesthetic complications: no   No notable events documented.  Last Vitals:  Vitals:   01/25/22 1520 01/25/22 1700  BP: (!) 151/77 128/85  Pulse: (!) 104 (!) 103  Resp: (!) 23 (!) 26  Temp:  36.6 C  SpO2: 97% 92%    Last Pain:  Vitals:   01/25/22 1700  PainSc: 0-No pain                 Marthenia Rolling

## 2022-01-25 NOTE — Op Note (Signed)
PREOPERATIVE DIAGNOSIS: Right index finger metacarpal head fracture, right index finger metacarpal phalangeal joint dislocation  POSTOPERATIVE DIAGNOSIS: Same  ATTENDING SURGEON: Dr. Bradly Bienenstock who scrubbed and present for the entire procedure  ASSISTANT SURGEON:  ANESTHESIA: Regional with IV sedation  OPERATIVE PROCEDURE: Open treatment of right index finger metacarpal head fracture involving the articular surface of the MP joint Irreducible metacarpal phalangeal joint dislocation Radiographs 3 views of the right hand   IMPLANTS: Biomet headless screws 2.5 millimeter screws 14 mm and 16 mm  EBL: Minimal  RADIOGRAPHIC INTERPRETATION: AP lateral oblique views of the hand do show the screw fixation of the metacarpal head good position  SURGICAL INDICATIONS: Patient is a right-hand-dominant gentleman who sustained a closed injury to his right index finger metacarpal head and metacarpal phalangeal joint Patient was seen evaluate the office and recommend undergo the above procedure.  The risks of surgery include but not limited to bleeding infection damage nearby nerves arteries or tendons loss of motion of the wrist and digits incomplete relief of symptoms and need for further surgical invention.  A signed informed consent was obtained the day of surgery.  SURGICAL TECHNIQUE: The patient was palpated find the preoperative holding area marked apart a marker made in the right hand indicate correct operative site.  Patient brought back to operating placed supine on the anesthesia table where the regional anesthetic was administered.  Patient tolerates well.  Well-padded tourniquet placed on the right brachium and stay with the appropriate drape.  The right upper extremities then prepped and draped normal sterile fashion.  A timeout was called the correct site was identified the procedure then begun.  Attention was then turned to the right index finger.  The limb was then elevated using Esmarch  semination the tourniquet insufflated.  Preoperative antibiotics were given prior to skin incision.  A longitude incision made directly over the dorsal aspect of the extensor mechanism.  Dissection carried down through the skin and subcutaneous tissue.  The extensor interval was split longitudinally between the interval between the [facility] and the EIP.  Following this the joint capsule was then identified and the fracture site was then opened up.  Open reduction was not possible as the joint was irreducible.  Was then able to gently free the volar plate which was entrapped within the joint from the metacarpal head.  A longitudinal split was then made in the volar plate which then was allowed for mobilization of the metacarpal and reduction of the MP joint.  Following reduction of the MP joint was able to reduce the fracture into its anatomical position and hold it with reduction clamps.  Following this 2 guidewires in place for the 2.5 mm headless compression screws.  These were overdrilled and the screw seated nicely.  There was good compression across the fracture site.  Final radiographs of the hand were then obtained.  The joint was then thoroughly irrigated.  The capsule was then closed with Monocryl suture.  The extensor interval closed with 4-0 Ethibond.  The skin was then closed with horizontal mattress Prolene sutures.  Adaptic dressing a sterile compressive bandage then applied.  The patient was then placed in a well-padded volar splint taken recovery room in good condition.  POSTOPERATIVE PLAN: Patient be discharged to home.  See him back the office in 7 to 10 days for wound check suture removal x-rays of the hand downstairs to see our therapist to begin about metacarpal ORIF of the metacarpal head protocol begin gentle range of  motion.  Hand-based splint the first visit.  Radiographs at each visit.

## 2022-01-25 NOTE — Transfer of Care (Signed)
Immediate Anesthesia Transfer of Care Note  Patient: Caleb Kent  Procedure(s) Performed: RIGHT INDEX FINGER METACARPAL HEAD OPEN REDUCTION INTERNAL FIXATION (Right: Finger)  Patient Location: PACU  Anesthesia Type:MAC combined with regional for post-op pain  Level of Consciousness: awake and alert   Airway & Oxygen Therapy: Patient Spontanous Breathing  Post-op Assessment: Report given to RN and Post -op Vital signs reviewed and stable  Post vital signs: Reviewed and stable  Last Vitals:  Vitals Value Taken Time  BP 128/85 01/25/22 1700  Temp    Pulse 98 01/25/22 1701  Resp 30 01/25/22 1701  SpO2 90 % 01/25/22 1701  Vitals shown include unvalidated device data.  Last Pain:  Vitals:   01/25/22 1352  PainSc: 8          Complications: No notable events documented.

## 2022-01-25 NOTE — Anesthesia Preprocedure Evaluation (Addendum)
Anesthesia Evaluation  Patient identified by MRN, date of birth, ID band Patient awake    Reviewed: Allergy & Precautions, NPO status , Patient's Chart, lab work & pertinent test results  History of Anesthesia Complications Negative for: history of anesthetic complications  Airway Mallampati: II  TM Distance: >3 FB Neck ROM: Full    Dental no notable dental hx.    Pulmonary asthma , Current Smoker and Patient abstained from smoking.,    Pulmonary exam normal        Cardiovascular negative cardio ROS Normal cardiovascular exam     Neuro/Psych negative neurological ROS     GI/Hepatic negative GI ROS, Neg liver ROS,   Endo/Other  negative endocrine ROS  Renal/GU negative Renal ROS  negative genitourinary   Musculoskeletal right index finger metacarpal head fracture displaced   Abdominal   Peds  Hematology negative hematology ROS (+)   Anesthesia Other Findings Day of surgery medications reviewed with patient.  Reproductive/Obstetrics negative OB ROS                            Anesthesia Physical Anesthesia Plan  ASA: 2  Anesthesia Plan: Regional and MAC   Post-op Pain Management: Tylenol PO (pre-op)*   Induction: Intravenous  PONV Risk Score and Plan: 1 and Treatment may vary due to age or medical condition, Ondansetron, Propofol infusion and Midazolam  Airway Management Planned: Natural Airway and Simple Face Mask  Additional Equipment: None  Intra-op Plan:   Post-operative Plan:   Informed Consent: I have reviewed the patients History and Physical, chart, labs and discussed the procedure including the risks, benefits and alternatives for the proposed anesthesia with the patient or authorized representative who has indicated his/her understanding and acceptance.       Plan Discussed with: CRNA  Anesthesia Plan Comments:        Anesthesia Quick Evaluation

## 2022-01-25 NOTE — Anesthesia Procedure Notes (Addendum)
  Anesthesia Regional Block: Supraclavicular block   Pre-Anesthetic Checklist: , timeout performed,  Correct Patient, Correct Site, Correct Laterality,  Correct Procedure, Correct Position, site marked,  Risks and benefits discussed,  Pre-op evaluation,  At surgeon's request and post-op pain management  Laterality: Right  Prep: Maximum Sterile Barrier Precautions used, chloraprep       Needles:  Injection technique: Single-shot  Needle Type: Echogenic Stimulator Needle     Needle Length: 9cm  Needle Gauge: 22     Additional Needles:   Procedures:,,,, ultrasound used (permanent image in chart),,    Narrative:  Start time: 01/25/2022 3:09 PM End time: 01/25/2022 3:12 PM Injection made incrementally with aspirations every 5 mL.  Performed by: Personally  Anesthesiologist: Brennan Bailey, MD  Additional Notes: Risks, benefits, and alternative discussed. Patient gave consent for procedure. Patient prepped and draped in sterile fashion. Sedation administered, patient remains easily responsive to voice. Relevant anatomy identified with ultrasound guidance. Local anesthetic given in 5cc increments with no signs or symptoms of intravascular injection. No pain or paraesthesias with injection. Patient monitored throughout procedure with signs of LAST or immediate complications. Tolerated well. Ultrasound image placed in chart.  Tawny Asal, MD

## 2022-01-25 NOTE — Anesthesia Procedure Notes (Addendum)
Procedure Name: General with mask airway Date/Time: 01/25/2022 4:00 PM  Performed by: Erick Colace, CRNAPre-anesthesia Checklist: Patient identified, Emergency Drugs available, Suction available and Patient being monitored Patient Re-evaluated:Patient Re-evaluated prior to induction Oxygen Delivery Method: Simple face mask Preoxygenation: Pre-oxygenation with 100% oxygen Induction Type: IV induction Ventilation: Oral airway inserted - appropriate to patient size Dental Injury: Teeth and Oropharynx as per pre-operative assessment

## 2022-01-25 NOTE — Discharge Instructions (Signed)
KEEP BANDAGE CLEAN AND DRY CALL OFFICE FOR F/U APPT [phone] IN 13 DAYS KEEP HAND ELEVATED ABOVE HEART OK TO APPLY ICE TO OPERATIVE AREA CONTACT OFFICE IF ANY WORSENING PAIN OR CONCERNS.  

## 2022-01-27 ENCOUNTER — Encounter (HOSPITAL_COMMUNITY): Payer: Self-pay | Admitting: Orthopedic Surgery

## 2022-07-18 ENCOUNTER — Emergency Department (HOSPITAL_COMMUNITY)
Admission: EM | Admit: 2022-07-18 | Discharge: 2022-07-19 | Disposition: A | Discharge status: Home / Self Care | Payer: Self-pay | Attending: Emergency Medicine | Admitting: Emergency Medicine

## 2022-07-18 DIAGNOSIS — R350 Frequency of micturition: Secondary | ICD-10-CM | POA: Insufficient documentation

## 2022-07-18 DIAGNOSIS — R739 Hyperglycemia, unspecified: Secondary | ICD-10-CM | POA: Insufficient documentation

## 2022-07-18 DIAGNOSIS — J45909 Unspecified asthma, uncomplicated: Secondary | ICD-10-CM | POA: Insufficient documentation

## 2022-07-18 DIAGNOSIS — N12 Tubulo-interstitial nephritis, not specified as acute or chronic: Secondary | ICD-10-CM | POA: Insufficient documentation

## 2022-07-18 DIAGNOSIS — R3915 Urgency of urination: Secondary | ICD-10-CM | POA: Insufficient documentation

## 2022-07-18 DIAGNOSIS — E871 Hypo-osmolality and hyponatremia: Secondary | ICD-10-CM | POA: Insufficient documentation

## 2022-07-18 DIAGNOSIS — D72829 Elevated white blood cell count, unspecified: Secondary | ICD-10-CM | POA: Insufficient documentation

## 2022-07-18 DIAGNOSIS — R7309 Other abnormal glucose: Secondary | ICD-10-CM

## 2022-07-18 LAB — URINALYSIS, ROUTINE W REFLEX MICROSCOPIC
Bilirubin Urine: NEGATIVE
Glucose, UA: NEGATIVE mg/dL
Hgb urine dipstick: NEGATIVE
Ketones, ur: NEGATIVE mg/dL
Nitrite: NEGATIVE
Protein, ur: 30 mg/dL — AB
Specific Gravity, Urine: 1.024 (ref 1.005–1.030)
pH: 5 (ref 5.0–8.0)

## 2022-07-18 NOTE — ED Triage Notes (Signed)
Left flank pain x4 days worse when having bowel movement. Denies any urinary complaints

## 2022-07-19 ENCOUNTER — Emergency Department (HOSPITAL_COMMUNITY): Payer: Self-pay

## 2022-07-19 LAB — BASIC METABOLIC PANEL
Anion gap: 10 (ref 5–15)
BUN: 17 mg/dL (ref 6–20)
CO2: 21 mmol/L — ABNORMAL LOW (ref 22–32)
Calcium: 8.9 mg/dL (ref 8.9–10.3)
Chloride: 102 mmol/L (ref 98–111)
Creatinine, Ser: 1.24 mg/dL (ref 0.61–1.24)
GFR, Estimated: >60 mL/min (ref >60)
Glucose, Bld: 111 mg/dL — ABNORMAL HIGH (ref 70–99)
Potassium: 4.3 mmol/L (ref 3.5–5.1)
Sodium: 133 mmol/L — ABNORMAL LOW (ref 135–145)

## 2022-07-19 LAB — CBC
HCT: 51.2 % (ref 39.0–52.0)
Hemoglobin: 16.9 g/dL (ref 13.0–17.0)
MCH: 27.4 pg (ref 26.0–34.0)
MCHC: 33 g/dL (ref 30.0–36.0)
MCV: 83 fL (ref 80.0–100.0)
Platelets: 181 10*3/uL (ref 150–400)
RBC: 6.17 MIL/uL — ABNORMAL HIGH (ref 4.22–5.81)
RDW: 13.2 % (ref 11.5–15.5)
WBC: 15.2 10*3/uL — ABNORMAL HIGH (ref 4.0–10.5)
nRBC: 0 % (ref 0.0–0.2)

## 2022-07-19 MED ORDER — CEFDINIR 300 MG PO CAPS: 300.0000 mg | ORAL_CAPSULE | Freq: Two times a day (BID) | ORAL | 0 refills | Status: DC

## 2022-07-19 MED ORDER — SODIUM CHLORIDE 0.9 % IV SOLN
2.0000 g | Freq: Once | INTRAVENOUS | Status: AC
  Administered 2022-07-19: 2 g via INTRAVENOUS
  Filled 2022-07-19: qty 20

## 2022-07-19 MED ORDER — MORPHINE SULFATE (PF) 4 MG/ML IV SOLN
4.0000 mg | Freq: Once | INTRAVENOUS | Status: AC
  Administered 2022-07-19: 4 mg via INTRAVENOUS
  Filled 2022-07-19: qty 1

## 2022-07-19 MED ORDER — KETOROLAC TROMETHAMINE 30 MG/ML IJ SOLN
30.0000 mg | Freq: Once | INTRAMUSCULAR | Status: AC
  Administered 2022-07-19: 30 mg via INTRAVENOUS
  Filled 2022-07-19: qty 1

## 2022-07-19 MED ORDER — OXYCODONE-ACETAMINOPHEN 5-325 MG PO TABS: 1.0000 | ORAL_TABLET | ORAL | 0 refills | Status: DC | PRN

## 2022-07-19 NOTE — ED Provider Notes (Signed)
[place] EMERGENCY DEPARTMENT AT [facility] Provider Note   CSN: [id number] Arrival date & time: 07/18/22  2249     History  Chief Complaint  Patient presents with   Flank Pain    Caleb Kent is a 27 y.o. male.  The history is provided by the patient.  Flank Pain  He has a history of asthma, attention deficit disorder, Tourette's syndrome and complains of left flank pain for the last 4 days.  There is no radiation of pain.  He denies nausea or vomiting.  He denies fever but has had some intermittent hot and cold feelings.  He has not had any sweats.  He denies dysuria but does endorse urinary urgency and frequency.  Pain did get worse this morning when he strained after a bowel movement.  He has been taking acetaminophen for pain without relief.   Home Medications Prior to Admission medications   Medication Sig Start Date End Date Taking? Authorizing Provider  albuterol (PROAIR HFA) 108 (90 Base) MCG/ACT inhaler INHALE 2 PUFFS INTO THE LUNGS EVERY 4 HORUS AS NEEDED FOR WHEEZING/SHORTNESS OF BREATH 06/04/17   Nelwyn Salisbury, MD  albuterol (PROVENTIL) (2.5 MG/3ML) 0.083% nebulizer solution USE 1 VIAL PER NEBULIZER EVERY 4 HOURS AS NEEDED FOR WHEEZING OR SHORTNESS OF BREATH 06/04/17   Nelwyn Salisbury, MD      Allergies    Patient has no known allergies.    Review of Systems   Review of Systems  Genitourinary:  Positive for flank pain.  All other systems reviewed and are negative.   Physical Exam Updated Vital Signs BP (!) 119/99   Pulse 96   Temp 99.6 F (37.6 C) (Oral)   Resp 20   Ht  (1.676 m)   Wt 125 kg   SpO2 97%   BMI 44.48 kg/m  Physical Exam Vitals and nursing note reviewed.   27 year old male, resting comfortably and in no acute distress. Vital signs are significant for elevated blood pressure. Oxygen saturation is 97%, which is normal. Head is normocephalic and atraumatic. PERRLA, EOMI. Oropharynx is clear. Neck is nontender and  supple without adenopathy. Back is nontender in the midline.  There is moderate left CVA tenderness. Lungs are clear without rales, wheezes, or rhonchi. Chest is nontender. Heart has regular rate and rhythm without murmur. Abdomen is soft, flat, nontender. Extremities have no cyanosis or edema, full range of motion is present. Skin is warm and dry without rash. Neurologic: Mental status is normal, cranial nerves are intact, moves all extremities equally.  ED Results / Procedures / Treatments   Labs (all labs ordered are listed, but only abnormal results are displayed) Labs Reviewed  URINALYSIS, ROUTINE W REFLEX MICROSCOPIC - Abnormal; Notable for the following components:      Result Value   Protein, ur 30 (*)    Leukocytes,Ua TRACE (*)    Bacteria, UA RARE (*)    All other components within normal limits  BASIC METABOLIC PANEL - Abnormal; Notable for the following components:   Sodium 133 (*)    CO2 21 (*)    Glucose, Bld 111 (*)    All other components within normal limits  CBC - Abnormal; Notable for the following components:   WBC 15.2 (*)    RBC 6.17 (*)    All other components within normal limits  URINE CULTURE   Radiology CT Renal Stone Study  Result Date: 07/19/2022 CLINICAL DATA:  Left flank pain x4  days EXAM: CT ABDOMEN AND PELVIS WITHOUT CONTRAST TECHNIQUE: Multidetector CT imaging of the abdomen and pelvis was performed following the standard protocol without IV contrast. RADIATION DOSE REDUCTION: This exam was performed according to the departmental dose-optimization program which includes automated exposure control, adjustment of the mA and/or kV according to patient size and/or use of iterative reconstruction technique. COMPARISON:  03/16/2003 FINDINGS: Motion degraded images. Lower chest: Lung bases are clear. Hepatobiliary: Unenhanced liver is unremarkable. Not discretely visualized, likely surgically absent. Pancreas: Within normal limits. Spleen: Grossly  unremarkable. Adrenals/Urinary Tract: Adrenal glands are within normal limits. Kidneys are grossly unremarkable. No renal calculi or hydronephrosis. Bladder is within normal limits. Stomach/Bowel: Stomach is within normal limits. No evidence bowel obstruction. Normal appendix (series 2/image 64). No colonic wall thickening or inflammatory changes. Vascular/Lymphatic: No evidence of abdominal aortic aneurysm. No suspicious abdominopelvic lymphadenopathy. Reproductive: Prostate is unremarkable. Other: No abdominopelvic ascites. Musculoskeletal: Visualized osseous structures are within normal limits. IMPRESSION: Motion degraded images. Negative CT abdomen/pelvis. Electronically Signed   By: Charline Bills M.D.   On: 07/19/2022 03:27    Procedures Procedures    Medications Ordered in ED Medications  morphine (PF) 4 MG/ML injection 4 mg (4 mg Intravenous Given 07/19/22 4098)  ketorolac (TORADOL) 30 MG/ML injection 30 mg (30 mg Intravenous Given 07/19/22 0309)  cefTRIAXone (ROCEPHIN) 2 g in sodium chloride 0.9 % 100 mL IVPB (2 g Intravenous New Bag/Given 07/19/22 0323)    ED Course/ Medical Decision Making/ A&P                             Medical Decision Making Amount and/or Complexity of Data Reviewed Labs: ordered. Radiology: ordered.  Risk Prescription drug management.   Left flank pain which may be musculoskeletal, possibly urolithiasis, possible pyelonephritis.  I have reviewed and interpreted his laboratory test and my interpretation is mild hyponatremia which is not felt to be clinically significant, elevated random glucose level, mild to moderate leukocytosis, urinalysis with 21-50 WBCs concerning for UTI.  I have ordered a urine culture, IV ceftriaxone for UTI, IV morphine and ketorolac for pain.  I have ordered renal stone protocol CT scan to rule out urolithiasis.  He had good relief of pain with above-noted treatment.  CT scan shows no acute process.  Specifically, no urolithiasis.   I am discharging him with prescription for cefdinir for pyelonephritis.  I have advised her to take over-the-counter NSAIDs and acetaminophen as needed for pain.  I have also prescribed a small number of oxycodone-acetaminophen to use for severe pain.  He will need to follow-up with his primary care provider to ensure clearance of infection.  Return precautions discussed.  Final Clinical Impression(s) / ED Diagnoses Final diagnoses:  Pyelonephritis  Elevated random blood glucose level  Hyponatremia    Rx / DC Orders ED Discharge Orders          Ordered    cefdinir (OMNICEF) 300 MG capsule  2 times daily        07/19/22 0407    oxyCODONE-acetaminophen (PERCOCET) 5-325 MG tablet  Every 4 hours PRN        07/19/22 0407              Dione Booze, MD 07/19/22 [phone]

## 2022-07-19 NOTE — Discharge Instructions (Addendum)
Drink plenty of fluids.  You may take acetaminophen and/your ibuprofen as needed for pain.  Reserve oxycodone-acetaminophen for severe pain.  Return to the emergency department if pain is not being adequately controlled, or if you start running high fever, or if you start vomiting.

## 2022-07-20 LAB — URINE CULTURE

## 2022-07-27 ENCOUNTER — Ambulatory Visit: Payer: Commercial Managed Care - HMO | Admitting: Family Medicine

## 2022-08-17 ENCOUNTER — Encounter: Payer: Self-pay | Admitting: Family Medicine

## 2022-08-17 ENCOUNTER — Ambulatory Visit (INDEPENDENT_AMBULATORY_CARE_PROVIDER_SITE_OTHER): Payer: Self-pay | Admitting: Family Medicine

## 2022-08-17 VITALS — BP 130/84 | HR 110 | Temp 98.3°F | Ht 68.11 in | Wt 306.5 lb

## 2022-08-17 DIAGNOSIS — R400 Somnolence: Secondary | ICD-10-CM

## 2022-08-17 DIAGNOSIS — J4520 Mild intermittent asthma, uncomplicated: Secondary | ICD-10-CM

## 2022-08-17 MED ORDER — PHENTERMINE HCL 15 MG PO CAPS: 15.0000 mg | ORAL_CAPSULE | ORAL | 0 refills | Status: DC

## 2022-08-17 MED ORDER — ALBUTEROL SULFATE HFA 108 (90 BASE) MCG/ACT IN AERS: INHALATION_SPRAY | RESPIRATORY_TRACT | 3 refills | Status: DC

## 2022-08-17 NOTE — Patient Instructions (Addendum)
Total calories: 2500 per day  Total protein: at least 111 grams per day   Total carbs: less than 90 grams per day  Get an app that helps tracks carbs and calories

## 2022-08-17 NOTE — Progress Notes (Unsigned)
New Patient Office Visit  Subjective    Patient ID: Caleb Kent, male    DOB: Nov 12, 1995  Age: 27 y.o. MRN: [id number]  CC:  Chief Complaint  Patient presents with   New Patient (Initial Visit)    HPI [name] presents to establish care Patient was a former patient Dr. Clent Ridges, was seen last in our office in 2020.  Patient is here today complaining of his asthma, states that he feels like he is snoring even while he is awake. States that he is always tired, sleeps all the time, snoring heavily, SO is here with him, states that he will sometimes stop breathing as well. His girlfriend is present in the visit as well, she reports she has witnessed him at night having apneic episodes.   Patient states he also wants to try losing weight. He reports he has always had a weight problem since he was a teenager. Has not tried any medication in the past, has tried working out and different diets but nothing has really worked for him in the past.   I have reviewed all aspects of the patient's medical history including social, family, and surgical history.   Current Outpatient Medications  Medication Instructions   albuterol (PROAIR HFA) 108 (90 Base) MCG/ACT inhaler INHALE 2 PUFFS INTO THE LUNGS EVERY 4 HORUS AS NEEDED FOR WHEEZING/SHORTNESS OF BREATH   albuterol (PROVENTIL) (2.5 MG/3ML) 0.083% nebulizer solution USE 1 VIAL PER NEBULIZER EVERY 4 HOURS AS NEEDED FOR WHEEZING OR SHORTNESS OF BREATH   cefdinir (OMNICEF) 300 mg, Oral, 2 times daily   oxyCODONE-acetaminophen (PERCOCET) 5-325 MG tablet 1 tablet, Oral, Every 4 hours PRN    Past Medical History:  Diagnosis Date   ADHD (attention deficit hyperactivity disorder)    "grew like"   Asthma    Shoulder separation, left, subsequent encounter    sees Dr. Eulah Pont    Strabismus    sees Dr. Maple Hudson   Tourette syndrome    resolved    Past Surgical History:  Procedure Laterality Date   EYE SURGERY Right    OPEN  REDUCTION INTERNAL FIXATION (ORIF) METACARPAL Right 01/25/2022   Procedure: RIGHT INDEX FINGER METACARPAL HEAD OPEN REDUCTION INTERNAL FIXATION;  Surgeon: Bradly Bienenstock, MD;  Location: [facility] OR;  Service: Orthopedics;  Laterality: Right;  90 min regional with iv sedation   SHOULDER ARTHROSCOPY WITH BANKART REPAIR Left 07/02/2015   Procedure: LEFT SHOULDER ARTHROSCOPY WITH BANKART REPAIR;  Surgeon: Sheral Apley, MD;  Location: [facility];  Service: Orthopedics;  Laterality: Left;    History reviewed. No pertinent family history.  Social History   Socioeconomic History   Marital status: Single    Spouse name: n/a   Number of children: 0   Years of education: 13   Highest education level: Not on file  Occupational History   Occupation: catering    Comment: part-time  Tobacco Use   Smoking status: Every Day    Packs/day: .2    Types: Cigarettes   Smokeless tobacco: Never  Vaping Use   Vaping Use: Never used  Substance and Sexual Activity   Alcohol use: Yes    Alcohol/week: 2.0 standard drinks of alcohol    Types: 2 Cans of beer per week   Drug use: No   Sexual activity: Not on file  Other Topics Concern   Not on file  Social History Narrative   Lives with paternal aunt and uncle.   Negative history of passive tobacco  smoke exposure   Care taker verifies today that the child's immunizations are up to date.    Social Determinants of Health   Financial Resource Strain: Not on file  Food Insecurity: Not on file  Transportation Needs: Not on file  Physical Activity: Not on file  Stress: Not on file  Social Connections: Not on file  Intimate Partner Violence: Not on file    Review of Systems  All other systems reviewed and are negative.       Objective    BP 130/84 (BP Location: Left Arm, Patient Position: Sitting, Cuff Size: Large)   Pulse (!) 110   Temp 98.3 F (36.8 C) (Oral)   Ht 5' 8.11" (1.73 m)   Wt (!) 306 lb 8 oz (139 kg)   SpO2 98%    BMI 46.45 kg/m   Physical Exam Vitals reviewed.  Constitutional:      Appearance: Normal appearance. He is well-groomed. He is morbidly obese.  Eyes:     Conjunctiva/sclera: Conjunctivae normal.  Neck:     Thyroid: No thyromegaly.  Cardiovascular:     Rate and Rhythm: Normal rate and regular rhythm.     Heart sounds: S1 normal and S2 normal. No murmur heard. Pulmonary:     Effort: Pulmonary effort is normal.     Breath sounds: Normal breath sounds and air entry. No rales.  Abdominal:     General: Bowel sounds are normal.  Musculoskeletal:     Right lower leg: No edema.     Left lower leg: No edema.  Neurological:     General: No focal deficit present.     Mental Status: He is alert and oriented to person, place, and time.     Gait: Gait is intact.  Psychiatric:        Mood and Affect: Mood and affect normal.     {Labs (Optional):23779}    Assessment & Plan:  Daytime somnolence -     Ambulatory referral to Pulmonology  Mild intermittent asthma without complication -     Albuterol Sulfate HFA; INHALE 2 PUFFS INTO THE LUNGS EVERY 4 HORUS AS NEEDED FOR WHEEZING/SHORTNESS OF BREATH  Dispense: 8.5 each; Refill: 3  Morbid obesity (HCC) -     Comprehensive metabolic panel; Future -     Hemoglobin A1c; Future -     TSH; Future -     Lipid panel; Future    No follow-ups on file.   Karie Georges, MD

## 2022-08-22 DIAGNOSIS — R400 Somnolence: Secondary | ICD-10-CM | POA: Insufficient documentation

## 2022-08-22 NOTE — Assessment & Plan Note (Signed)
Mild intermittent, will continue albuterol inhaler, lungs are clear on exam. I believe that his SOB symptoms are related to the sleep apnea rather than asthma. Could also be obesity related hypoventilatory syndrome.

## 2022-08-23 NOTE — Assessment & Plan Note (Signed)
Patient meets many criteria for OSA, in fact has a snoring sound to his breathing while in the visit today. Sending to pulmonary for sleep study.

## 2022-08-23 NOTE — Assessment & Plan Note (Addendum)
I have had an extensive 30 minute conversation today with the patient about healthy eating habits, exercise, calorie and carb goals for sustainable and successful weight loss. I gave the patient caloric and protein daily intake values as well as described the importance of increasing fiber and water intake. I discussed weight loss medications that could be used in the treatment of this patient. Handouts on low carb eating were given to the patient.  I am ordering a full set of labs to look for other pathology/ organ dysfunction. We discussed using phentermine 15 mg daily for appetite suppression and he is agreeable,. The goals I gave him are listed below. He will see me back in 2 months for follow up.   Total calories: 2500 per day  Total protein: at least 111 grams per day   Total carbs: less than 90 grams per day

## 2022-09-21 ENCOUNTER — Ambulatory Visit (INDEPENDENT_AMBULATORY_CARE_PROVIDER_SITE_OTHER): Payer: Self-pay | Admitting: Nurse Practitioner

## 2022-09-21 ENCOUNTER — Encounter: Payer: Self-pay | Admitting: Nurse Practitioner

## 2022-09-21 VITALS — BP 130/74 | HR 98 | Temp 98.1°F | Ht 68.0 in | Wt 304.4 lb

## 2022-09-21 DIAGNOSIS — G4710 Hypersomnia, unspecified: Secondary | ICD-10-CM

## 2022-09-21 DIAGNOSIS — R400 Somnolence: Secondary | ICD-10-CM

## 2022-09-21 DIAGNOSIS — R0683 Snoring: Secondary | ICD-10-CM

## 2022-09-21 NOTE — Progress Notes (Signed)
@Patient  ID: Caleb Kent, male    DOB: 04-08-1995, 27 y.o.   MRN: [id number]  Chief Complaint  Patient presents with   Consult    Family history of sleep apnea, snore, day time sleepiness, wakes up choking and gasping. Pt spouse states she has seen his stop breathing.     Referring provider: Karie Georges, MD  HPI: 27 year old male, active smoker referred for sleep consult.  Past medical history significant for mild asthma, obesity.  TEST/EVENTS:   09/21/2022: Today-sleep consult Patient presents today with his significant other for sleep consult, referred by Dr. Casimiro Needle.  He has had a lot of trouble with his sleep.  He is currently working third shift.  His fiance tells me that even before he started working 3rd shift, he would still sleep all day.  He has a lot of daytime grogginess.  Wakes in the morning feeling poorly rested.  She notices he snores at night and also wakes up gasping for air.  Tends to sleep sitting up.  Wakes frequently throughout the night.  His wife also tells me that she has noticed that he stops breathing when he sleeps.  Denies any morning headaches, drowsy driving, sleep parasomnia/paralysis.  No history of narcolepsy or symptoms of cataplexy. Goes to bed around 8 AM.  Falls asleep quickly.  Wakes 2-3 times.  Officially gets up around 5 PM.  He does operate [employer] lifts at his job- never fallen asleep on one.  Weight is relatively stable over the last 2 years.  Never had a previous sleep study.  Not on any oxygen therapy. He has mild intermittent asthma, controlled on as needed [place].  No history of cardiac disease, stroke or diabetes. He is an active smoker.  He also smokes marijuana.  Drinks alcohol twice a week.  Lives with his fiance.  No significant family history.  Epworth 20  No Known Allergies  Immunization History  Administered Date(s) Administered   DTP 06/30/1994, 08/24/1994, 11/02/1994, 07/22/1995, 08/04/1998   HIB (PRP-OMP)  06/30/1994, 08/24/1994, 11/02/1994, 07/22/1995   Hepatitis B 06-10-1994, 06/13/1994, 10/19/1994   Influenza Split 12/30/2010, 03/06/2012   Influenza Whole 02/12/2006, 12/23/2008, 12/27/2009   Influenza,inj,Quad PF,6+ Mos 02/26/2013, 01/20/2015, 04/24/2016, 04/12/2017   MMR 05/11/1995, 08/04/1998   Meningococcal Conjugate 05/07/2012   Meningococcal polysaccharide vaccine (MPSV4) 02/08/2010   OPV 06/01/1994, 08/24/1994, 10/09/1994, 08/04/1998   PFIZER(Purple Top)SARS-COV-2 Vaccination 06/30/2019, 07/21/2019   PPD Test 05/09/2012   Td 09/11/2006   Tdap 09/11/2006   Varicella 09/24/1995, 09/11/2006    Past Medical History:  Diagnosis Date   ADHD (attention deficit hyperactivity disorder)    "grew like"   Asthma    Shoulder separation, left, subsequent encounter    sees Dr. Eulah Pont    Strabismus    sees Dr. Maple Hudson   Tourette syndrome    resolved    Tobacco History: Social History   Tobacco Use  Smoking Status Every Day   Packs/day: .2   Types: Cigarettes  Smokeless Tobacco Never   Ready to quit: Not Answered Counseling given: Not Answered   Outpatient Medications Prior to Visit  Medication Sig Dispense Refill   albuterol (PROAIR HFA) 108 (90 Base) MCG/ACT inhaler INHALE 2 PUFFS INTO THE LUNGS EVERY 4 HORUS AS NEEDED FOR WHEEZING/SHORTNESS OF BREATH 8.5 each 3   albuterol (PROVENTIL) (2.5 MG/3ML) 0.083% nebulizer solution USE 1 VIAL PER NEBULIZER EVERY 4 HOURS AS NEEDED FOR WHEEZING OR SHORTNESS OF BREATH 450 mL 3   cefdinir (OMNICEF) 300 MG capsule  Take 1 capsule (300 mg total) by mouth 2 (two) times daily. 28 capsule 0   oxyCODONE-acetaminophen (PERCOCET) 5-325 MG tablet Take 1 tablet by mouth every 4 (four) hours as needed for moderate pain. 10 tablet 0   phentermine 15 MG capsule Take 1 capsule (15 mg total) by mouth every morning. 30 capsule 0   phentermine 15 MG capsule Take 1 capsule (15 mg total) by mouth every morning. 30 capsule 0   No facility-administered  medications prior to visit.     Review of Systems:   Constitutional: No weight loss or gain, night sweats, fevers, chills, or lassitude. +fatigue HEENT: No difficulty swallowing, tooth/dental problems, or sore throat. No sneezing, itching, ear ache, nasal congestion, or post nasal drip. +occasional headaches CV:  No chest pain, orthopnea, PND, swelling in lower extremities, anasarca, dizziness, palpitations, syncope Resp: +snoring, witnessed apneas, shortness of breath with exertion. No excess mucus or change in color of mucus. No productive or non-productive. No hemoptysis. No wheezing.  No chest wall deformity GI:  No heartburn, indigestion GU: No dysuria, change in color of urine, urgency or frequency.   Skin: No rash, lesions, ulcerations MSK:  No joint pain or swelling.   Neuro: No dizziness or lightheadedness.  Psych: +stable depression, anxiety. Mood stable. +sleep disturbance    Physical Exam:  BP 130/74   Pulse 98   Temp 98.1 F (36.7 C)   Ht 5\' 8"  (1.727 m)   Wt (!) 304 lb 6.4 oz (138.1 kg)   SpO2 95%   BMI 46.28 kg/m   GEN: Pleasant, interactive, well-kempt; morbidly obese; in no acute distress. HEENT:  Normocephalic and atraumatic. PERRLA. Sclera white. Nasal turbinates pink, moist and patent bilaterally. No rhinorrhea present. Oropharynx pink and moist, without exudate or edema. No lesions, ulcerations, or postnasal drip. Mallampati III/IV NECK:  Supple w/ fair ROM. No JVD present. Normal carotid impulses w/o bruits. Thyroid symmetrical with no goiter or nodules palpated. No lymphadenopathy.   CV: RRR, no m/r/g, no peripheral edema. Pulses intact, +2 bilaterally. No cyanosis, pallor or clubbing. PULMONARY:  Unlabored, regular breathing. Clear bilaterally A&P w/o wheezes/rales/rhonchi. No accessory muscle use.  GI: BS present and normoactive. Soft, non-tender to palpation. No organomegaly or masses detected.  MSK: No erythema, warmth or tenderness. Cap refil <2 sec  all extrem. No deformities or joint swelling noted.  Neuro: A/Ox3. No focal deficits noted.   Skin: Warm, no lesions or rashe Psych: Normal affect and behavior. Judgement and thought content appropriate.     Lab Results:  CBC    Component Value Date/Time   WBC 15.2 (H) 07/18/2022 2343   RBC 6.17 (H) 07/18/2022 2343   HGB 16.9 07/18/2022 2343   HCT 51.2 07/18/2022 2343   PLT 181 07/18/2022 2343   MCV 83.0 07/18/2022 2343   MCH 27.4 07/18/2022 2343   MCHC 33.0 07/18/2022 2343   RDW 13.2 07/18/2022 2343   LYMPHSABS 2.2 06/01/2016 0926   MONOABS 0.6 06/01/2016 0926   EOSABS 0.3 06/01/2016 0926   BASOSABS 0.1 06/01/2016 0926    BMET    Component Value Date/Time   NA 133 (L) 07/18/2022 2343   K 4.3 07/18/2022 2343   CL 102 07/18/2022 2343   CO2 21 (L) 07/18/2022 2343   GLUCOSE 111 (H) 07/18/2022 2343   BUN 17 07/18/2022 2343   CREATININE 1.24 07/18/2022 2343   CALCIUM 8.9 07/18/2022 2343   GFRNONAA >60 07/18/2022 2343    BNP No results found for: "BNP"  Imaging:  No results found.        No data to display          No results found for: "NITRICOXIDE"      Assessment & Plan:   Hypersomnolence He has snoring, excessive daytime sleepiness, nocturnal apneic events, restless sleep. BMI 46. Epworth 20. Given this,  I am concerned he could have sleep disordered breathing with obstructive sleep apnea. He will need sleep study for further evaluation.    - discussed how weight can impact sleep and risk for sleep disordered breathing - discussed options to assist with weight loss: combination of diet modification, cardiovascular and strength training exercises   - had an extensive discussion regarding the adverse health consequences related to untreated sleep disordered breathing - specifically discussed the risks for hypertension, coronary artery disease, cardiac dysrhythmias, cerebrovascular disease, and diabetes - lifestyle modification discussed   -  discussed how sleep disruption can increase risk of accidents, particularly when driving - safe driving practices were discussed  Patient Instructions  Given your symptoms, I am concerned that you may have sleep disordered breathing with sleep apnea. You will need a sleep study for further evaluation. Someone will contact you to schedule this.   We discussed how untreated sleep apnea puts an individual at risk for cardiac arrhthymias, pulm HTN, DM, stroke and increases their risk for daytime accidents. We also briefly reviewed treatment options including weight loss, side sleeping position, oral appliance, CPAP therapy or referral to ENT for possible surgical options  Use caution when driving and pull over if you become sleepy.  Follow up in 6 weeks with Katie Placida Cambre,NP to go over sleep study results, or sooner, if needed     Morbid obesity (HCC) BMI 46. Healthy weight loss encouraged   Loud snoring See above   I spent 35 minutes of dedicated to the care of this patient on the date of this encounter to include pre-visit review of records, face-to-face time with the patient discussing conditions above, post visit ordering of testing, clinical documentation with the electronic health record, making appropriate referrals as documented, and communicating necessary findings to members of the patients care team.  Noemi Chapel, NP 10/05/2022  Pt aware and understands NP's role.

## 2022-09-21 NOTE — Patient Instructions (Signed)
Given your symptoms, I am concerned that you may have sleep disordered breathing with sleep apnea. You will need a sleep study for further evaluation. Someone will contact you to schedule this.   We discussed how untreated sleep apnea puts an individual at risk for cardiac arrhthymias, pulm HTN, DM, stroke and increases their risk for daytime accidents. We also briefly reviewed treatment options including weight loss, side sleeping position, oral appliance, CPAP therapy or referral to ENT for possible surgical options  Use caution when driving and pull over if you become sleepy.  Follow up in 6 weeks with Caleb Laekyn Rayos,NP to go over sleep study results, or sooner, if needed   

## 2022-09-26 ENCOUNTER — Telehealth: Payer: Self-pay | Admitting: Family Medicine

## 2022-09-26 NOTE — Telephone Encounter (Signed)
Dr. Fry verified that patient could still be his patient. 

## 2022-10-05 ENCOUNTER — Encounter: Payer: Self-pay | Admitting: Nurse Practitioner

## 2022-10-05 DIAGNOSIS — G4710 Hypersomnia, unspecified: Secondary | ICD-10-CM | POA: Insufficient documentation

## 2022-10-05 DIAGNOSIS — R0683 Snoring: Secondary | ICD-10-CM | POA: Insufficient documentation

## 2022-10-05 NOTE — Assessment & Plan Note (Signed)
See above

## 2022-10-05 NOTE — Assessment & Plan Note (Signed)
BMI 46. Healthy weight loss encouraged 

## 2022-10-05 NOTE — Assessment & Plan Note (Signed)
He has snoring, excessive daytime sleepiness, nocturnal apneic events, restless sleep. BMI 46. Epworth 20. Given this,  I am concerned he could have sleep disordered breathing with obstructive sleep apnea. He will need sleep study for further evaluation.    - discussed how weight can impact sleep and risk for sleep disordered breathing - discussed options to assist with weight loss: combination of diet modification, cardiovascular and strength training exercises   - had an extensive discussion regarding the adverse health consequences related to untreated sleep disordered breathing - specifically discussed the risks for hypertension, coronary artery disease, cardiac dysrhythmias, cerebrovascular disease, and diabetes - lifestyle modification discussed   - discussed how sleep disruption can increase risk of accidents, particularly when driving - safe driving practices were discussed  Patient Instructions  Given your symptoms, I am concerned that you may have sleep disordered breathing with sleep apnea. You will need a sleep study for further evaluation. Someone will contact you to schedule this.   We discussed how untreated sleep apnea puts an individual at risk for cardiac arrhthymias, pulm HTN, DM, stroke and increases their risk for daytime accidents. We also briefly reviewed treatment options including weight loss, side sleeping position, oral appliance, CPAP therapy or referral to ENT for possible surgical options  Use caution when driving and pull over if you become sleepy.  Follow up in 6 weeks with Katie Seanne Chirico,NP to go over sleep study results, or sooner, if needed

## 2022-10-18 ENCOUNTER — Ambulatory Visit: Payer: Self-pay | Admitting: Family Medicine

## 2022-11-15 ENCOUNTER — Ambulatory Visit (INDEPENDENT_AMBULATORY_CARE_PROVIDER_SITE_OTHER): Payer: Self-pay | Admitting: Nurse Practitioner

## 2022-11-15 ENCOUNTER — Encounter: Payer: Self-pay | Admitting: Nurse Practitioner

## 2022-11-15 ENCOUNTER — Telehealth: Payer: Self-pay | Admitting: Family Medicine

## 2022-11-15 VITALS — BP 118/88 | HR 91 | Temp 98.3°F | Ht 68.0 in | Wt 313.0 lb

## 2022-11-15 DIAGNOSIS — R400 Somnolence: Secondary | ICD-10-CM

## 2022-11-15 NOTE — Telephone Encounter (Signed)
Prescription Request  11/15/2022  LOV:  08/17/2022  What is the name of the medication or equipment?  albuterol (PROAIR HFA) 108 (90 Base) MCG/ACT inhaler albuterol (PROVENTIL) (2.5 MG/3ML) 0.083% nebulizer solution phentermine 15 MG capsule  Have you contacted your pharmacy to request a refill? No   Which pharmacy would you like this sent to?   [facility] [address], [place] - [address] [place] [address] Phone: [phone] Fax: [phone]    Patient notified that their request is being sent to the clinical staff for review and that they should receive a response within 2 business days.   Please advise at Mobile [phone] (mobile)

## 2022-11-15 NOTE — Progress Notes (Signed)
Visit canceled due to not having sleep study. Will reschedule for 8 weeks.

## 2022-11-16 ENCOUNTER — Other Ambulatory Visit: Payer: Self-pay

## 2022-11-16 DIAGNOSIS — J4520 Mild intermittent asthma, uncomplicated: Secondary | ICD-10-CM

## 2022-11-16 MED ORDER — ALBUTEROL SULFATE HFA 108 (90 BASE) MCG/ACT IN AERS: INHALATION_SPRAY | RESPIRATORY_TRACT | 3 refills | Status: DC

## 2022-11-16 MED ORDER — ALBUTEROL SULFATE (2.5 MG/3ML) 0.083% IN NEBU: INHALATION_SOLUTION | RESPIRATORY_TRACT | 3 refills | Status: DC

## 2022-11-16 MED ORDER — PHENTERMINE HCL 15 MG PO CAPS: 15.0000 mg | ORAL_CAPSULE | ORAL | 0 refills | Status: DC

## 2022-11-16 NOTE — Telephone Encounter (Signed)
Rx sent 

## 2022-12-20 ENCOUNTER — Encounter: Payer: Self-pay | Admitting: Family Medicine

## 2023-01-03 NOTE — Telephone Encounter (Signed)
Pt mom susan is calling and need medications to go to  [id number] - [place], [place] - [address] AT [facility] [address] Phone: [phone]  Fax: [phone]    They never pick [facility] the [facility] at [facility]

## 2023-01-04 ENCOUNTER — Telehealth: Payer: Self-pay | Admitting: Family Medicine

## 2023-01-04 ENCOUNTER — Other Ambulatory Visit: Payer: Self-pay

## 2023-01-04 DIAGNOSIS — J4520 Mild intermittent asthma, uncomplicated: Secondary | ICD-10-CM

## 2023-01-04 MED ORDER — ALBUTEROL SULFATE HFA 108 (90 BASE) MCG/ACT IN AERS: INHALATION_SPRAY | RESPIRATORY_TRACT | 3 refills | Status: DC

## 2023-01-04 MED ORDER — PHENTERMINE HCL 15 MG PO CAPS: 15.0000 mg | ORAL_CAPSULE | ORAL | 5 refills | Status: DC

## 2023-01-04 MED ORDER — ALBUTEROL SULFATE (2.5 MG/3ML) 0.083% IN NEBU: INHALATION_SOLUTION | RESPIRATORY_TRACT | 3 refills | Status: DC

## 2023-01-04 NOTE — Addendum Note (Signed)
Addended by: Gershon Crane A on: 01/04/2023 12:35 PM   Modules accepted: Orders

## 2023-01-04 NOTE — Telephone Encounter (Signed)
I refilled the Phentermine. I also wrote a RX for the nebulizer to fax to his pharmacy

## 2023-01-04 NOTE — Telephone Encounter (Signed)
Pt mother call and stated pt need a Nebulizer RX for pt.

## 2023-01-04 NOTE — Telephone Encounter (Signed)
Pt needs refill for Phentermine LOV 08/17/22. Pt is also requesting a a prescription for a nebulizer machine since his appointment with pulmonology is scheduled for 01/29/23. Please advise

## 2023-01-05 NOTE — Telephone Encounter (Signed)
Pt mother picked up nebulized machine, completed AERO[employer]OW form which was placed in [name] filing holder by her office.

## 2023-01-05 NOTE — Telephone Encounter (Signed)
Spoke with pt guardian Ms Manson Passey aware to come by the office for the nebulizer machine. Stated that she will be here this afternoon

## 2023-01-07 ENCOUNTER — Other Ambulatory Visit: Payer: Self-pay

## 2023-01-07 ENCOUNTER — Encounter (HOSPITAL_COMMUNITY): Payer: Self-pay | Admitting: Emergency Medicine

## 2023-01-07 ENCOUNTER — Inpatient Hospital Stay (HOSPITAL_COMMUNITY)
Admission: EM | Admit: 2023-01-07 | Discharge: 2023-01-12 | DRG: 205 | Disposition: A | Discharge status: Home Health | Payer: 59 | Attending: Family Medicine | Admitting: Family Medicine

## 2023-01-07 ENCOUNTER — Emergency Department (HOSPITAL_COMMUNITY): Payer: 59

## 2023-01-07 DIAGNOSIS — J9601 Acute respiratory failure with hypoxia: Principal | ICD-10-CM | POA: Diagnosis present

## 2023-01-07 DIAGNOSIS — D696 Thrombocytopenia, unspecified: Secondary | ICD-10-CM | POA: Diagnosis present

## 2023-01-07 DIAGNOSIS — Z6841 Body Mass Index (BMI) 40.0 and over, adult: Secondary | ICD-10-CM

## 2023-01-07 DIAGNOSIS — G4710 Hypersomnia, unspecified: Secondary | ICD-10-CM | POA: Diagnosis present

## 2023-01-07 DIAGNOSIS — E662 Morbid (severe) obesity with alveolar hypoventilation: Principal | ICD-10-CM | POA: Diagnosis present

## 2023-01-07 DIAGNOSIS — J309 Allergic rhinitis, unspecified: Secondary | ICD-10-CM | POA: Diagnosis not present

## 2023-01-07 DIAGNOSIS — Z1152 Encounter for screening for COVID-19: Secondary | ICD-10-CM

## 2023-01-07 DIAGNOSIS — J45902 Unspecified asthma with status asthmaticus: Secondary | ICD-10-CM

## 2023-01-07 DIAGNOSIS — R0902 Hypoxemia: Principal | ICD-10-CM

## 2023-01-07 DIAGNOSIS — J45901 Unspecified asthma with (acute) exacerbation: Secondary | ICD-10-CM | POA: Diagnosis not present

## 2023-01-07 DIAGNOSIS — R400 Somnolence: Secondary | ICD-10-CM | POA: Diagnosis not present

## 2023-01-07 DIAGNOSIS — G8929 Other chronic pain: Secondary | ICD-10-CM | POA: Diagnosis present

## 2023-01-07 DIAGNOSIS — F952 Tourette's disorder: Secondary | ICD-10-CM | POA: Diagnosis present

## 2023-01-07 DIAGNOSIS — D721 Eosinophilia, unspecified: Secondary | ICD-10-CM | POA: Diagnosis present

## 2023-01-07 DIAGNOSIS — F909 Attention-deficit hyperactivity disorder, unspecified type: Secondary | ICD-10-CM | POA: Diagnosis not present

## 2023-01-07 DIAGNOSIS — R000 Tachycardia, unspecified: Secondary | ICD-10-CM | POA: Diagnosis present

## 2023-01-07 DIAGNOSIS — R0689 Other abnormalities of breathing: Secondary | ICD-10-CM

## 2023-01-07 DIAGNOSIS — F17210 Nicotine dependence, cigarettes, uncomplicated: Secondary | ICD-10-CM | POA: Diagnosis present

## 2023-01-07 DIAGNOSIS — E66813 Obesity, class 3: Secondary | ICD-10-CM | POA: Diagnosis present

## 2023-01-07 DIAGNOSIS — J4521 Mild intermittent asthma with (acute) exacerbation: Secondary | ICD-10-CM | POA: Diagnosis present

## 2023-01-07 DIAGNOSIS — T380X5A Adverse effect of glucocorticoids and synthetic analogues, initial encounter: Secondary | ICD-10-CM | POA: Diagnosis not present

## 2023-01-07 DIAGNOSIS — J9602 Acute respiratory failure with hypercapnia: Secondary | ICD-10-CM

## 2023-01-07 DIAGNOSIS — R109 Unspecified abdominal pain: Secondary | ICD-10-CM | POA: Diagnosis present

## 2023-01-07 LAB — CBC WITH DIFFERENTIAL/PLATELET
Abs Immature Granulocytes: 0.04 10*3/uL (ref 0.00–0.07)
Basophils Absolute: 0.1 10*3/uL (ref 0.0–0.1)
Basophils Relative: 1 %
Eosinophils Absolute: 0.6 10*3/uL — ABNORMAL HIGH (ref 0.0–0.5)
Eosinophils Relative: 5 %
HCT: 49.3 % (ref 39.0–52.0)
Hemoglobin: 15.3 g/dL (ref 13.0–17.0)
Immature Granulocytes: 0 %
Lymphocytes Relative: 21 %
Lymphs Abs: 2.5 10*3/uL (ref 0.7–4.0)
MCH: 26.4 pg (ref 26.0–34.0)
MCHC: 31 g/dL (ref 30.0–36.0)
MCV: 85 fL (ref 80.0–100.0)
Monocytes Absolute: 1 10*3/uL (ref 0.1–1.0)
Monocytes Relative: 9 %
Neutro Abs: 7.6 10*3/uL (ref 1.7–7.7)
Neutrophils Relative %: 64 %
Platelets: 147 10*3/uL — ABNORMAL LOW (ref 150–400)
RBC: 5.8 MIL/uL (ref 4.22–5.81)
RDW: 13.6 % (ref 11.5–15.5)
WBC: 11.7 10*3/uL — ABNORMAL HIGH (ref 4.0–10.5)
nRBC: 0 % (ref 0.0–0.2)

## 2023-01-07 LAB — RESPIRATORY PANEL BY PCR

## 2023-01-07 LAB — I-STAT VENOUS BLOOD GAS, ED
Acid-Base Excess: 1 mmol/L (ref 0.0–2.0)
Bicarbonate: 29.1 mmol/L — ABNORMAL HIGH (ref 20.0–28.0)
Calcium, Ion: 1.16 mmol/L (ref 1.15–1.40)
HCT: 49 % (ref 39.0–52.0)
Hemoglobin: 16.7 g/dL (ref 13.0–17.0)
O2 Saturation: 78 %
Potassium: 3.7 mmol/L (ref 3.5–5.1)
Sodium: 140 mmol/L (ref 135–145)
TCO2: 31 mmol/L (ref 22–32)
pCO2, Ven: 57.2 mm[Hg] (ref 44–60)
pH, Ven: 7.315 (ref 7.25–7.43)
pO2, Ven: 48 mm[Hg] — ABNORMAL HIGH (ref 32–45)

## 2023-01-07 LAB — BASIC METABOLIC PANEL
Anion gap: 10 (ref 5–15)
BUN: 16 mg/dL (ref 6–20)
CO2: 24 mmol/L (ref 22–32)
Calcium: 8.7 mg/dL — ABNORMAL LOW (ref 8.9–10.3)
Chloride: 104 mmol/L (ref 98–111)
Creatinine, Ser: 1.36 mg/dL — ABNORMAL HIGH (ref 0.61–1.24)
GFR, Estimated: >60 mL/min (ref >60)
Glucose, Bld: 98 mg/dL (ref 70–99)
Potassium: 3.9 mmol/L (ref 3.5–5.1)
Sodium: 138 mmol/L (ref 135–145)

## 2023-01-07 LAB — MRSA NEXT GEN BY PCR, NASAL: MRSA by PCR Next Gen: NOT DETECTED

## 2023-01-07 LAB — RESP PANEL BY RT-PCR (RSV, FLU A&B, COVID)  RVPGX2
Influenza A by PCR: NEGATIVE
Influenza B by PCR: NEGATIVE
Resp Syncytial Virus by PCR: NEGATIVE
SARS Coronavirus 2 by RT PCR: NEGATIVE

## 2023-01-07 LAB — HIV ANTIBODY (ROUTINE TESTING W REFLEX): HIV Screen 4th Generation wRfx: NONREACTIVE

## 2023-01-07 LAB — I-STAT CG4 LACTIC ACID, ED: Lactic Acid, Venous: 0.9 mmol/L (ref 0.5–1.9)

## 2023-01-07 MED ORDER — POLYETHYLENE GLYCOL 3350 17 G PO PACK: 17.0000 g | PACK | Freq: Two times a day (BID) | ORAL | Status: DC | PRN

## 2023-01-07 MED ORDER — METHYLPREDNISOLONE SODIUM SUCC 125 MG IJ SOLR
125.0000 mg | INTRAMUSCULAR | Status: AC
  Administered 2023-01-07: 125 mg via INTRAVENOUS
  Filled 2023-01-07: qty 2

## 2023-01-07 MED ORDER — ALBUTEROL SULFATE (2.5 MG/3ML) 0.083% IN NEBU
5.0000 mg | INHALATION_SOLUTION | Freq: Once | RESPIRATORY_TRACT | Status: DC
  Filled 2023-01-07: qty 6

## 2023-01-07 MED ORDER — ONDANSETRON HCL 4 MG PO TABS: 4.0000 mg | ORAL_TABLET | Freq: Four times a day (QID) | ORAL | Status: DC | PRN

## 2023-01-07 MED ORDER — ACETAMINOPHEN 325 MG PO TABS: 650.0000 mg | ORAL_TABLET | Freq: Four times a day (QID) | ORAL | Status: DC | PRN

## 2023-01-07 MED ORDER — MAGNESIUM SULFATE 2 GM/50ML IV SOLN
2.0000 g | Freq: Once | INTRAVENOUS | Status: AC
  Administered 2023-01-07: 2 g via INTRAVENOUS
  Filled 2023-01-07: qty 50

## 2023-01-07 MED ORDER — ENOXAPARIN SODIUM 80 MG/0.8ML IJ SOSY
70.0000 mg | PREFILLED_SYRINGE | INTRAMUSCULAR | Status: DC
  Administered 2023-01-07 – 2023-01-11 (×5): 70 mg via SUBCUTANEOUS
  Filled 2023-01-07 (×7): qty 0.7

## 2023-01-07 MED ORDER — ARFORMOTEROL TARTRATE 15 MCG/2ML IN NEBU
15.0000 ug | INHALATION_SOLUTION | Freq: Two times a day (BID) | RESPIRATORY_TRACT | Status: DC
  Administered 2023-01-07 – 2023-01-09 (×5): 15 ug via RESPIRATORY_TRACT
  Filled 2023-01-07 (×5): qty 2

## 2023-01-07 MED ORDER — ACETAMINOPHEN 650 MG RE SUPP: 650.0000 mg | Freq: Four times a day (QID) | RECTAL | Status: DC | PRN

## 2023-01-07 MED ORDER — IPRATROPIUM-ALBUTEROL 0.5-2.5 (3) MG/3ML IN SOLN
3.0000 mL | Freq: Once | RESPIRATORY_TRACT | Status: AC
  Administered 2023-01-07: 3 mL via RESPIRATORY_TRACT
  Filled 2023-01-07: qty 3

## 2023-01-07 MED ORDER — LORATADINE 10 MG PO TABS
10.0000 mg | ORAL_TABLET | Freq: Every day | ORAL | Status: DC
  Administered 2023-01-07 – 2023-01-12 (×6): 10 mg via ORAL
  Filled 2023-01-07 (×7): qty 1

## 2023-01-07 MED ORDER — PREDNISONE 20 MG PO TABS
40.0000 mg | ORAL_TABLET | Freq: Every day | ORAL | Status: AC
  Administered 2023-01-09 – 2023-01-12 (×4): 40 mg via ORAL
  Filled 2023-01-07 (×4): qty 2

## 2023-01-07 MED ORDER — SENNOSIDES-DOCUSATE SODIUM 8.6-50 MG PO TABS: 1.0000 | ORAL_TABLET | Freq: Two times a day (BID) | ORAL | Status: DC | PRN

## 2023-01-07 MED ORDER — BUDESONIDE 0.5 MG/2ML IN SUSP
0.5000 mg | Freq: Two times a day (BID) | RESPIRATORY_TRACT | Status: DC
  Administered 2023-01-07 – 2023-01-09 (×5): 0.5 mg via RESPIRATORY_TRACT
  Filled 2023-01-07 (×5): qty 2

## 2023-01-07 MED ORDER — INFLUENZA VIRUS VACC SPLIT PF (FLUZONE) 0.5 ML IM SUSY: 0.5000 mL | PREFILLED_SYRINGE | INTRAMUSCULAR | Status: DC

## 2023-01-07 MED ORDER — METHYLPREDNISOLONE SODIUM SUCC 40 MG IJ SOLR
40.0000 mg | Freq: Two times a day (BID) | INTRAMUSCULAR | Status: AC
  Administered 2023-01-07 – 2023-01-08 (×2): 40 mg via INTRAVENOUS
  Filled 2023-01-07 (×2): qty 1

## 2023-01-07 MED ORDER — ONDANSETRON HCL 4 MG/2ML IJ SOLN: 4.0000 mg | Freq: Four times a day (QID) | INTRAMUSCULAR | Status: DC | PRN

## 2023-01-07 MED ORDER — IPRATROPIUM-ALBUTEROL 0.5-2.5 (3) MG/3ML IN SOLN
3.0000 mL | RESPIRATORY_TRACT | Status: DC | PRN
  Administered 2023-01-09: 3 mL via RESPIRATORY_TRACT
  Filled 2023-01-07: qty 3

## 2023-01-07 NOTE — ED Triage Notes (Addendum)
Pt here for SOB- using inhaler of albuterol for wheezing. Saw MD yesterday and started with px breathing treatments. Mom states that she puts treatment mask on and he knocks it off and falls asleep so fast. Seeing sleep doc in month for apnea work up  Pt given breathing treatment in triage and reports feeling much better. Pt has large body habitus.

## 2023-01-07 NOTE — H&P (Signed)
History and Physical    Patient: Caleb Kent WGN:[id number] DOB: 04-21-1995 DOA: 01/07/2023 DOS: the patient was seen and examined on 01/07/2023 PCP: Nelwyn Salisbury, MD  Patient coming from: Home.  Lives with his mother.  Independently ambulates at baseline.  Chief Complaint:  Chief Complaint  Patient presents with   Wheezing   HPI: Caleb Kent is a 27 y.o. male with PMH of childhood asthma and Tourette's syndrome, allergic rhinitis, chronic abdominal pain, ADHD, hypersomnolence, morbid obesity and tobacco use disorder presenting with apnea and paroxysmal dyspnea.   Per triage note, patient here with shortness of breath and wheezing that did not improve with nebulizer/inhaler.  Patient reports apnea and paroxysmal dyspnea for weeks.  He denies shortness of breath but admits to exertional dyspnea and wheezing.He has productive cough with clear phlegm.  He denies hemoptysis.  He reports runny nose for about 3 weeks but denies sore throat, fever or chills.  Denies GI or UTI symptoms.  He had a nebulizer prescribed by PCP 2 days ago.  Reportedly, he has evaluation for sleep apnea in a month.  Patient lives with his mother.  Independently ambulates at baseline.  Reports smoking about half a pack a day.  Admits to occasional alcohol but denies binge drinking or withdrawal symptoms.  Denies recreational drug use.  In ED, stable vitals but briefly desaturated to 82% and put 4 L.  VBG with pCO2 of 57.2 and pH of 7.315. Cr 1.36.  WBC 11.7 with mild eosinophilia.  Platelet 147.  Lactic acid negative.  Limited RVP nonreactive.  CXR without acute finding.  EKG with sinus tachycardia to 108.  Patient was started on BiPAP, Solu-Medrol, DuoNeb and albuterol.  By the time of my evaluation, patient was off BiPAP and oxygen saturating in upper 90s.   Review of Systems: As mentioned in the history of present illness. All other systems reviewed and are negative. Past Medical History:   Diagnosis Date   ADHD (attention deficit hyperactivity disorder)    "grew like"   Asthma    Shoulder separation, left, subsequent encounter    sees Dr. Eulah Pont    Strabismus    sees Dr. Maple Hudson   Tourette syndrome    resolved   Past Surgical History:  Procedure Laterality Date   EYE SURGERY Right    OPEN REDUCTION INTERNAL FIXATION (ORIF) METACARPAL Right 01/25/2022   Procedure: RIGHT INDEX FINGER METACARPAL HEAD OPEN REDUCTION INTERNAL FIXATION;  Surgeon: Bradly Bienenstock, MD;  Location: [facility];  Service: Orthopedics;  Laterality: Right;  90 min regional with iv sedation   SHOULDER ARTHROSCOPY WITH BANKART REPAIR Left 07/02/2015   Procedure: LEFT SHOULDER ARTHROSCOPY WITH BANKART REPAIR;  Surgeon: Sheral Apley, MD;  Location: [facility];  Service: Orthopedics;  Laterality: Left;   Social History:  reports that he has been smoking cigarettes. He has never used smokeless tobacco. He reports current alcohol use of about 2.0 standard drinks of alcohol per week. He reports that he does not use drugs.  No Known Allergies  History reviewed. No pertinent family history.  Prior to Admission medications   Medication Sig Start Date End Date Taking? Authorizing Provider  albuterol (PROAIR HFA) 108 (90 Base) MCG/ACT inhaler INHALE 2 PUFFS INTO THE LUNGS EVERY 4 HORUS AS NEEDED FOR WHEEZING/SHORTNESS OF BREATH 01/04/23   Nelwyn Salisbury, MD  albuterol (PROVENTIL) (2.5 MG/3ML) 0.083% nebulizer solution USE 1 VIAL PER NEBULIZER EVERY 4 HOURS AS NEEDED FOR WHEEZING OR SHORTNESS OF BREATH  01/04/23   Nelwyn Salisbury, MD  cefdinir (OMNICEF) 300 MG capsule Take 1 capsule (300 mg total) by mouth 2 (two) times daily. 07/19/22   Dione Booze, MD  phentermine 15 MG capsule Take 1 capsule (15 mg total) by mouth every morning. 01/04/23   Nelwyn Salisbury, MD    Physical Exam: Vitals:   01/07/23 0615 01/07/23 0645 01/07/23 0716 01/07/23 0717  BP: 129/70 129/80    Pulse: 88 91    Resp: (!) 22 20     Temp:   98.1 F (36.7 C)   TempSrc:   Axillary   SpO2: 100% 97%    Weight:    136.1 kg  Height:    5\' 8"  (1.727 m)   GENERAL: No apparent distress.  Nontoxic. HEENT: MMM.  Vision and hearing grossly intact.  Nasal congestion NECK: Supple.  No apparent JVD.  RESP:  No IWOB.  Fair aeration bilaterally but limited exam due to body habitus.  No wheeze or crackles CVS:  RRR. Heart sounds normal.  ABD/GI/GU: BS+. Abd soft, NTND.  MSK/EXT:   No apparent deformity. Moves extremities.  Trace BLE edema SKIN: Acanthosis nigricans NEURO: Awake and alert. Oriented appropriately.  No apparent focal neuro deficit. PSYCH: Calm. Normal affect.   Data Reviewed: See HPI  Assessment and Plan: Principal Problem:   Acute asthma exacerbation Active Problems:   ADHD   Allergic rhinitis   Daytime somnolence   Morbid obesity (HCC)   Acute respiratory failure with hypoxia and hypercapnia (HCC)  Acute respiratory failure with hypoxia and hypercapnia: POA.  Most likely due to OSA and possible OHS.  However, he was wheezing on presentation raising concern for asthma exacerbation.  Desaturated to 82% on room air when he fall asleep.  VBG 7.31/57/48/29.  Patient tends to desaturate every time he goes to sleep and started snoring.  Limited RVP negative.  CXR without acute finding. -BiPAP as needed -Continue Solu-Medrol -Brovana and Pulmicort with as needed DuoNebs -TOC consult for NIPPV -Check full RVP  Acute asthma exacerbation: History of childhood asthma and allergic rhinitis.  Reportedly short of breath and wheezing on arrival.  -Management as above  Allergic rhinitis -Claritin and Flonase no spray  Daytime somnolence -Continue phentermine  Elevated creatinine: Does not meet criteria for AKI. Recent Labs    07/18/22 2343 01/07/23 0454  BUN 17 16  CREATININE 1.24 1.36*  -Continue monitoring  Mild thrombocytopenia Recent Labs  Lab 01/07/23 0454  PLT 147*  -Continue  monitoring  Morbid obesity Body mass index is 45.61 kg/m. -Continue phentermine -Encourage lifestyle change to lose weight    Advance Care Planning:   Code Status: Full Code   Consults: None  Family Communication: None at bedside  Severity of Illness: The appropriate patient status for this patient is OBSERVATION. Observation status is judged to be reasonable and necessary in order to provide the required intensity of service to ensure the patient's safety. The patient's presenting symptoms, physical exam findings, and initial radiographic and laboratory data in the context of their medical condition is felt to place them at decreased risk for further clinical deterioration. Furthermore, it is anticipated that the patient will be medically stable for discharge from the hospital within 2 midnights of admission.   Author: Almon Hercules, MD 01/07/2023 11:02 AM  For on call review [email or web address].

## 2023-01-07 NOTE — ED Notes (Signed)
Patient was placed on 4 liters  nasal cannula, saturate was 90 to 91, then went to 84 to 85. I increase him to 5 liters he went to 94 to 96, when he start snoring he goes to 75 to 81.

## 2023-01-07 NOTE — ED Notes (Signed)
Pt ambulated aaround green zone and back to room pt did well ambulating. I've asked pt multiple times if pt was SOB or dizzy. Pt stated no. Pt o2 was dropping the lowest was 84 and when stop walking pt o2 went up to 94. He wanted to keep walking however pt stated that he felt fine and that he was just a little hot. Pt hr was above 110 but no higher then 120 while walking.

## 2023-01-07 NOTE — Progress Notes (Signed)
Pt placed on bipap per order while sleeping due to apnea and desating in the 70s. Pt is tolerating it well. RT will monitor as needed.

## 2023-01-07 NOTE — ED Notes (Signed)
ED TO INPATIENT HANDOFF REPORT  ED Nurse Name and Phone #: [phone]  S Name/Age/Gender Caleb Kent 27 y.o. male Room/Bed: 010C/010C  Code Status   Code Status: Full Code  Home/SNF/Other Home Patient oriented to: self, place, time, and situation Is this baseline? Yes   Triage Complete: Triage complete  Chief Complaint Acute asthma exacerbation [J45.901] Acute respiratory failure with hypoxia and hypercapnia (HCC) [J96.01, J96.02]  Triage Note Pt here for SOB- using inhaler of albuterol for wheezing. Saw MD yesterday and started with px breathing treatments. Mom states that she puts treatment mask on and he knocks it off and falls asleep so fast. Seeing sleep doc in month for apnea work up  Pt given breathing treatment in triage and reports feeling much better. Pt has large body habitus.    Allergies No Known Allergies  Level of Care/Admitting Diagnosis ED Disposition     ED Disposition  Admit   Condition  --   Comment  Hospital Area: [facility] [[id number]]  Level of Care: Progressive [102]  Admit to Progressive based on following criteria: RESPIRATORY PROBLEMS hypoxemic/hypercapnic respiratory failure that is responsive to NIPPV (BiPAP) or High Flow Nasal Cannula (6-80 lpm). Frequent assessment/intervention, no > Q2 hrs < Q4 hrs, to maintain oxygenation and pulmonary hygiene.  May place patient in observation at [facility] or [facility] if equivalent level of care is available:: No  Covid Evaluation: Confirmed COVID Negative  Diagnosis: Acute respiratory failure with hypoxia and hypercapnia [facility]) [[id number]]  Admitting Physician: Almon Hercules [[id number]]  Attending Physician: Almon Hercules [[id number]]          B Medical/Surgery History Past Medical History:  Diagnosis Date   ADHD (attention deficit hyperactivity disorder)    "grew like"   Asthma    Shoulder separation, left, subsequent encounter    sees Dr. Eulah Pont    Strabismus     sees Dr. Maple Hudson   Tourette syndrome    resolved   Past Surgical History:  Procedure Laterality Date   EYE SURGERY Right    OPEN REDUCTION INTERNAL FIXATION (ORIF) METACARPAL Right 01/25/2022   Procedure: RIGHT INDEX FINGER METACARPAL HEAD OPEN REDUCTION INTERNAL FIXATION;  Surgeon: Bradly Bienenstock, MD;  Location: [facility] OR;  Service: Orthopedics;  Laterality: Right;  90 min regional with iv sedation   SHOULDER ARTHROSCOPY WITH BANKART REPAIR Left 07/02/2015   Procedure: LEFT SHOULDER ARTHROSCOPY WITH BANKART REPAIR;  Surgeon: Sheral Apley, MD;  Location: [facility];  Service: Orthopedics;  Laterality: Left;     A IV Location/Drains/Wounds Patient Lines/Drains/Airways Status     Active Line/Drains/Airways     Name Placement date Placement time Site Days   Peripheral IV 01/07/23 20 G Anterior;Left;Proximal Forearm 01/07/23  0514  Forearm  less than 1            Intake/Output Last 24 hours No intake or output data in the 24 hours ending 01/07/23 1536  Labs/Imaging Results for orders placed or performed during the hospital encounter of 01/07/23 (from the past 48 hour(s))  CBC with Differential     Status: Abnormal   Collection Time: 01/07/23  4:54 AM  Result Value Ref Range   WBC 11.7 (H) 4.0 - 10.5 K/uL   RBC 5.80 4.22 - 5.81 MIL/uL   Hemoglobin 15.3 13.0 - 17.0 g/dL   HCT 28.4 13.2 - 44.0 %   MCV 85.0 80.0 - 100.0 fL   MCH 26.4 26.0 - 34.0 pg   MCHC  31.0 30.0 - 36.0 g/dL   RDW 52.8 41.3 - 24.4 %   Platelets 147 (L) 150 - 400 K/uL    Comment: REPEATED TO VERIFY   nRBC 0.0 0.0 - 0.2 %   Neutrophils Relative % 64 %   Neutro Abs 7.6 1.7 - 7.7 K/uL   Lymphocytes Relative 21 %   Lymphs Abs 2.5 0.7 - 4.0 K/uL   Monocytes Relative 9 %   Monocytes Absolute 1.0 0.1 - 1.0 K/uL   Eosinophils Relative 5 %   Eosinophils Absolute 0.6 (H) 0.0 - 0.5 K/uL   Basophils Relative 1 %   Basophils Absolute 0.1 0.0 - 0.1 K/uL   Immature Granulocytes 0 %   Abs Immature  Granulocytes 0.04 0.00 - 0.07 K/uL    Comment: Performed at [facility], [address] [place], [place] [address]  Basic metabolic panel     Status: Abnormal   Collection Time: 01/07/23  4:54 AM  Result Value Ref Range   Sodium 138 135 - 145 mmol/L   Potassium 3.9 3.5 - 5.1 mmol/L   Chloride 104 98 - 111 mmol/L   CO2 24 22 - 32 mmol/L   Glucose, Bld 98 70 - 99 mg/dL    Comment: Glucose reference range applies only to samples taken after fasting for at least 8 hours.   BUN 16 6 - 20 mg/dL   Creatinine, Ser 2.53 (H) 0.61 - 1.24 mg/dL   Calcium 8.7 (L) 8.9 - 10.3 mg/dL   GFR, Estimated >66 >44 mL/min    Comment: (NOTE) Calculated using the CKD-EPI Creatinine Equation (2021)    Anion gap 10 5 - 15    Comment: Performed at [facility], [address] [place], [place] [address]  I-Stat CG4 Lactic Acid, ED     Status: None   Collection Time: 01/07/23  5:12 AM  Result Value Ref Range   Lactic Acid, Venous 0.9 0.5 - 1.9 mmol/L  Resp panel by RT-PCR (RSV, Flu A&B, Covid) Anterior Nasal Swab     Status: None   Collection Time: 01/07/23  5:17 AM   Specimen: Anterior Nasal Swab  Result Value Ref Range   SARS Coronavirus 2 by RT PCR NEGATIVE NEGATIVE   Influenza A by PCR NEGATIVE NEGATIVE   Influenza B by PCR NEGATIVE NEGATIVE    Comment: (NOTE) The Xpert Xpress SARS-CoV-2/FLU/RSV plus assay is intended as an aid in the diagnosis of influenza from Nasopharyngeal swab specimens and should not be used as a sole basis for treatment. Nasal washings and aspirates are unacceptable for Xpert Xpress SARS-CoV-2/FLU/RSV testing.  Fact Sheet for Patients: BloggerCourse.com  Fact Sheet for Healthcare Providers: SeriousBroker.it  This test is not yet approved or cleared by the [place] FDA and has been authorized for detection and/or diagnosis of SARS-CoV-2 by FDA under an Emergency Use Authorization (EUA). This EUA will  remain in effect (meaning this test can be used) for the duration of the COVID-19 declaration under Section 564(b)(1) of the Act, 21 U.S.C. section 360bbb-3(b)(1), unless the authorization is terminated or revoked.     Resp Syncytial Virus by PCR NEGATIVE NEGATIVE    Comment: (NOTE) Fact Sheet for Patients: BloggerCourse.com  Fact Sheet for Healthcare Providers: SeriousBroker.it  This test is not yet approved or cleared by the [place] FDA and has been authorized for detection and/or diagnosis of SARS-CoV-2 by FDA under an Emergency Use Authorization (EUA). This EUA will remain in effect (meaning this test can  be used) for the duration of the COVID-19 declaration under Section 564(b)(1) of the Act, 21 U.S.C. section 360bbb-3(b)(1), unless the authorization is terminated or revoked.  Performed at [facility], [address], [place], [place] [address]   I-Stat venous blood gas, [facility], [facility], [facility])     Status: Abnormal   Collection Time: 01/07/23  5:27 AM  Result Value Ref Range   pH, Ven 7.315 7.25 - 7.43   pCO2, Ven 57.2 44 - 60 mmHg   pO2, Ven 48 (H) 32 - 45 mmHg   Bicarbonate 29.1 (H) 20.0 - 28.0 mmol/L   TCO2 31 22 - 32 mmol/L   O2 Saturation 78 %   Acid-Base Excess 1.0 0.0 - 2.0 mmol/L   Sodium 140 135 - 145 mmol/L   Potassium 3.7 3.5 - 5.1 mmol/L   Calcium, Ion 1.16 1.15 - 1.40 mmol/L   HCT 49.0 39.0 - 52.0 %   Hemoglobin 16.7 13.0 - 17.0 g/dL   Sample type VENOUS   HIV Antibody (routine testing w rflx)     Status: None   Collection Time: 01/07/23  9:28 AM  Result Value Ref Range   HIV Screen 4th Generation wRfx Non Reactive Non Reactive    Comment: Performed at [facility], [address], [place], [place] [address]   DG Chest 2 View  Result Date: 01/07/2023 CLINICAL DATA:  Shortness of breath and wheezing. EXAM: CHEST - 2 VIEW COMPARISON:  None Available. FINDINGS: The cardiomediastinal  silhouette and vascular pattern are normal. Lateral view compromised by motion. The lungs are expiratory. This limits visualization of the lower lung fields. The visualized lungs are clear. The sulci are sharp. No acute osseous findings. Mild lower thoracic kyphosis. IMPRESSION: Expiratory chest with no evidence of acute disease, but limited view of the bases. Lateral view compromised by motion. Electronically Signed   By: Almira Bar M.D.   On: 01/07/2023 01:50    Pending Labs Unresulted Labs (From admission, onward)     Start     Ordered   01/14/23 0500  Creatinine, serum  (enoxaparin (LOVENOX)    CrCl >/= 30 ml/min)  Weekly,   R     Comments: while on enoxaparin therapy    01/07/23 1101   01/08/23 0500  Renal function panel  Tomorrow morning,   R        01/07/23 1115   01/08/23 0500  Magnesium  Tomorrow morning,   R        01/07/23 1115   01/08/23 0500  CBC  Tomorrow morning,   R        01/07/23 1115   01/07/23 0801  Respiratory (~20 pathogens) panel by PCR  (Respiratory panel by PCR (~20 pathogens, ~24 hr TAT)  w precautions)  Once,   R        01/07/23 0801            Vitals/Pain Today's Vitals   01/07/23 0800 01/07/23 0819 01/07/23 1100 01/07/23 1200  BP:   (!) 140/86 128/70  Pulse:   90 85  Resp:   (!) 25 (!) 30  Temp:      TempSrc:      SpO2:   96% 94%  Weight:      Height:      PainSc: 0-No pain 0-No pain      Isolation Precautions Droplet precaution  Medications Medications  albuterol (PROVENTIL) (2.5 MG/3ML) 0.083% nebulizer solution 5 mg (0 mg Nebulization Hold  01/07/23 1610)  albuterol (PROVENTIL) (2.5 MG/3ML) 0.083% nebulizer solution 5 mg (0 mg Nebulization Hold 01/07/23 0623)  methylPREDNISolone sodium succinate (SOLU-MEDROL) 40 mg/mL injection 40 mg (has no administration in time range)    Followed by  predniSONE (DELTASONE) tablet 40 mg (has no administration in time range)  arformoterol (BROVANA) nebulizer solution 15 mcg (15 mcg Nebulization Given  01/07/23 0848)  budesonide (PULMICORT) nebulizer solution 0.5 mg (0.5 mg Nebulization Given 01/07/23 0834)  ipratropium-albuterol (DUONEB) 0.5-2.5 (3) MG/3ML nebulizer solution 3 mL (has no administration in time range)  loratadine (CLARITIN) tablet 10 mg (10 mg Oral Given 01/07/23 0908)  enoxaparin (LOVENOX) injection 70 mg (has no administration in time range)  acetaminophen (TYLENOL) tablet 650 mg (has no administration in time range)    Or  acetaminophen (TYLENOL) suppository 650 mg (has no administration in time range)  ondansetron (ZOFRAN) tablet 4 mg (has no administration in time range)    Or  ondansetron (ZOFRAN) injection 4 mg (has no administration in time range)  polyethylene glycol (MIRALAX / GLYCOLAX) packet 17 g (has no administration in time range)  senna-docusate (Senokot-S) tablet 1 tablet (has no administration in time range)  ipratropium-albuterol (DUONEB) 0.5-2.5 (3) MG/3ML nebulizer solution 3 mL (3 mLs Nebulization Given 01/07/23 0103)  methylPREDNISolone sodium succinate (SOLU-MEDROL) 125 mg/2 mL injection 125 mg (125 mg Intravenous Given 01/07/23 9604)  magnesium sulfate IVPB 2 g 50 mL (0 g Intravenous Stopped 01/07/23 0752)    Mobility walks     Focused Assessments Pulmonary Assessment Handoff:  Lung sounds:   O2 Device: Nasal Cannula O2 Flow Rate (L/min): 4 L/min    R Recommendations: See Admitting Provider Note  Report given to:   Additional Notes: Patient need a cpap at night . Patient saturation will drop when he goes to sleep. He is on oxygen 4 liter nasal cannula

## 2023-01-07 NOTE — ED Provider Notes (Signed)
[place] EMERGENCY DEPARTMENT AT [facility] Provider Note   CSN: [id number] Arrival date & time: 01/07/23  0011     History  Chief Complaint  Patient presents with   Wheezing    Caleb Kent is a 27 y.o. male.  The history is provided by the patient and the spouse.  Wheezing Severity:  Moderate Severity compared to prior episodes:  More severe Onset quality:  Gradual Duration:  8 weeks Timing:  Constant Progression:  Worsening Chronicity:  Recurrent (had asthma as a child) Context: not tartrazine   Relieved by:  Nothing Worsened by:  Nothing Ineffective treatments:  Home nebulizer (Mom just picked up a nebulizer yesterday, another doctor had given patient an inhaler in August without relief) Associated symptoms: cough   Associated symptoms: no chest pain, no fever and no sputum production   Associated symptoms comment:  Congestion, and hypoxia  Patient with ADHD, Tourette's, and h/o childhood asthma who presents with 2 months of wheezing and congestion.  Was given an inhaler without relief.  Mom picked him up and got him a nebulizer yesterday.     Past Medical History:  Diagnosis Date   ADHD (attention deficit hyperactivity disorder)    "grew like"   Asthma    Shoulder separation, left, subsequent encounter    sees Dr. Eulah Pont    Strabismus    sees Dr. Maple Hudson   Tourette syndrome    resolved     Home Medications Prior to Admission medications   Medication Sig Start Date End Date Taking? Authorizing Provider  albuterol (PROAIR HFA) 108 (90 Base) MCG/ACT inhaler INHALE 2 PUFFS INTO THE LUNGS EVERY 4 HORUS AS NEEDED FOR WHEEZING/SHORTNESS OF BREATH 01/04/23   Nelwyn Salisbury, MD  albuterol (PROVENTIL) (2.5 MG/3ML) 0.083% nebulizer solution USE 1 VIAL PER NEBULIZER EVERY 4 HOURS AS NEEDED FOR WHEEZING OR SHORTNESS OF BREATH 01/04/23   Nelwyn Salisbury, MD  cefdinir (OMNICEF) 300 MG capsule Take 1 capsule (300 mg total) by mouth 2 (two) times daily.  07/19/22   Dione Booze, MD  phentermine 15 MG capsule Take 1 capsule (15 mg total) by mouth every morning. 01/04/23   Nelwyn Salisbury, MD      Allergies    Patient has no known allergies.    Review of Systems   Review of Systems  Unable to perform ROS: Acuity of condition  Constitutional:  Negative for fever.  HENT:  Positive for congestion.   Eyes:  Negative for redness.  Respiratory:  Positive for cough and wheezing. Negative for sputum production.   Cardiovascular:  Negative for chest pain, palpitations and leg swelling.  Gastrointestinal:  Negative for vomiting.    Physical Exam Updated Vital Signs BP (!) 137/95   Pulse 76   Temp 98.9 F (37.2 C)   Resp 20   SpO2 95%  Physical Exam Vitals and nursing note reviewed.  Constitutional:      General: He is not in acute distress.    Appearance: He is well-developed. He is not diaphoretic.  HENT:     Head: Normocephalic and atraumatic.     Nose: Nose normal.     Mouth/Throat:     Mouth: Mucous membranes are moist.  Eyes:     Extraocular Movements: Extraocular movements intact.     Conjunctiva/sclera: Conjunctivae normal.  Cardiovascular:     Rate and Rhythm: Normal rate and regular rhythm.     Pulses: Normal pulses.     Heart sounds: Normal heart  sounds.  Pulmonary:     Effort: Pulmonary effort is normal. Tachypnea present.     Breath sounds: Decreased air movement present. Wheezing present. No rales.  Abdominal:     General: Bowel sounds are normal.     Palpations: Abdomen is soft.     Tenderness: There is no abdominal tenderness. There is no guarding or rebound.  Musculoskeletal:        General: Normal range of motion.     Cervical back: Normal range of motion and neck supple.  Skin:    General: Skin is warm and dry.     Capillary Refill: Capillary refill takes less than 2 seconds.  Neurological:     General: No focal deficit present.     Mental Status: He is alert.     Deep Tendon Reflexes: Reflexes normal.   Psychiatric:        Mood and Affect: Mood normal.     ED Results / Procedures / Treatments   Labs (all labs ordered are listed, but only abnormal results are displayed) Results for orders placed or performed during the hospital encounter of 01/07/23  Resp panel by RT-PCR (RSV, Flu A&B, Covid) Anterior Nasal Swab   Specimen: Anterior Nasal Swab  Result Value Ref Range   SARS Coronavirus 2 by RT PCR NEGATIVE NEGATIVE   Influenza A by PCR NEGATIVE NEGATIVE   Influenza B by PCR NEGATIVE NEGATIVE   Resp Syncytial Virus by PCR NEGATIVE NEGATIVE  CBC with Differential  Result Value Ref Range   WBC 11.7 (H) 4.0 - 10.5 K/uL   RBC 5.80 4.22 - 5.81 MIL/uL   Hemoglobin 15.3 13.0 - 17.0 g/dL   HCT 29.5 62.1 - 30.8 %   MCV 85.0 80.0 - 100.0 fL   MCH 26.4 26.0 - 34.0 pg   MCHC 31.0 30.0 - 36.0 g/dL   RDW 65.7 84.6 - 96.2 %   Platelets 147 (L) 150 - 400 K/uL   nRBC 0.0 0.0 - 0.2 %   Neutrophils Relative % 64 %   Neutro Abs 7.6 1.7 - 7.7 K/uL   Lymphocytes Relative 21 %   Lymphs Abs 2.5 0.7 - 4.0 K/uL   Monocytes Relative 9 %   Monocytes Absolute 1.0 0.1 - 1.0 K/uL   Eosinophils Relative 5 %   Eosinophils Absolute 0.6 (H) 0.0 - 0.5 K/uL   Basophils Relative 1 %   Basophils Absolute 0.1 0.0 - 0.1 K/uL   Immature Granulocytes 0 %   Abs Immature Granulocytes 0.04 0.00 - 0.07 K/uL  Basic metabolic panel  Result Value Ref Range   Sodium 138 135 - 145 mmol/L   Potassium 3.9 3.5 - 5.1 mmol/L   Chloride 104 98 - 111 mmol/L   CO2 24 22 - 32 mmol/L   Glucose, Bld 98 70 - 99 mg/dL   BUN 16 6 - 20 mg/dL   Creatinine, Ser 9.52 (H) 0.61 - 1.24 mg/dL   Calcium 8.7 (L) 8.9 - 10.3 mg/dL   GFR, Estimated >84 >13 mL/min   Anion gap 10 5 - 15  I-Stat venous blood gas, ([facility] ED, [facility], [facility])  Result Value Ref Range   pH, Ven 7.315 7.25 - 7.43   pCO2, Ven 57.2 44 - 60 mmHg   pO2, Ven 48 (H) 32 - 45 mmHg   Bicarbonate 29.1 (H) 20.0 - 28.0 mmol/L   TCO2 31 22 - 32 mmol/L   O2 Saturation 78 %    Acid-Base Excess 1.0 0.0 -  2.0 mmol/L   Sodium 140 135 - 145 mmol/L   Potassium 3.7 3.5 - 5.1 mmol/L   Calcium, Ion 1.16 1.15 - 1.40 mmol/L   HCT 49.0 39.0 - 52.0 %   Hemoglobin 16.7 13.0 - 17.0 g/dL   Sample type VENOUS   I-Stat CG4 Lactic Acid, ED  Result Value Ref Range   Lactic Acid, Venous 0.9 0.5 - 1.9 mmol/L   DG Chest 2 View  Result Date: 01/07/2023 CLINICAL DATA:  Shortness of breath and wheezing. EXAM: CHEST - 2 VIEW COMPARISON:  None Available. FINDINGS: The cardiomediastinal silhouette and vascular pattern are normal. Lateral view compromised by motion. The lungs are expiratory. This limits visualization of the lower lung fields. The visualized lungs are clear. The sulci are sharp. No acute osseous findings. Mild lower thoracic kyphosis. IMPRESSION: Expiratory chest with no evidence of acute disease, but limited view of the bases. Lateral view compromised by motion. Electronically Signed   By: Almira Bar M.D.   On: 01/07/2023 01:50     EKG EKG Interpretation Date/Time:  Sunday January 07 2023 00:30:21 EDT Ventricular Rate:  108 PR Interval:  134 QRS Duration:  84 QT Interval:  332 QTC Calculation: 444 R Axis:   89  Text Interpretation: Sinus tachycardia Otherwise normal ECG No previous ECGs available Confirmed by Dione Booze ([id number]) on 01/07/2023 1:25:53 AM  Radiology DG Chest 2 View  Result Date: 01/07/2023 CLINICAL DATA:  Shortness of breath and wheezing. EXAM: CHEST - 2 VIEW COMPARISON:  None Available. FINDINGS: The cardiomediastinal silhouette and vascular pattern are normal. Lateral view compromised by motion. The lungs are expiratory. This limits visualization of the lower lung fields. The visualized lungs are clear. The sulci are sharp. No acute osseous findings. Mild lower thoracic kyphosis. IMPRESSION: Expiratory chest with no evidence of acute disease, but limited view of the bases. Lateral view compromised by motion. Electronically Signed   By: Almira Bar M.D.   On: 01/07/2023 01:50    Procedures .Critical Care E&M  Performed by: Cy Blamer, MD Critical care provider statement:    Critical care end time:  01/07/2023 6:18 AM   Critical care was time spent personally by me on the following activities:  Ordering and performing treatments and interventions, ordering and review of laboratory studies, ordering and review of radiographic studies, pulse oximetry, re-evaluation of patient's condition, review of old charts, examination of patient, evaluation of patient's response to treatment, discussions with consultants and obtaining history from patient or surrogate   Care discussed with: admitting provider   After initial E/M assessment, critical care services were subsequently performed that were exclusive of separately billable procedures or treatment.       Medications Ordered in ED Medications  albuterol (PROVENTIL) (2.5 MG/3ML) 0.083% nebulizer solution 5 mg (has no administration in time range)  ipratropium-albuterol (DUONEB) 0.5-2.5 (3) MG/3ML nebulizer solution 3 mL (3 mLs Nebulization Given 01/07/23 0103)    ED Course/ Medical Decision Making/ A&P                                 Medical Decision Making Patient with wheezing and congestion x 2 months per mom she picked up a nebulizer for patient yesterday  Amount and/or Complexity of Data Reviewed Independent Historian: parent    Details: See above  External Data Reviewed: notes.    Details: Previous notes  Labs: ordered.    Details: Hypoxia on VBG by  me.  Negative covid and flu.  Elevated white count 11.7, normal hemoglobin 15.3, normal platelets.  Normal sodium 138, normal potassium 3.9, elevated creatinine 1.36 Radiology: ordered and independent interpretation performed.    Details: Hypoventilation on CXR by me no CHF ECG/medicine tests: ordered and independent interpretation performed. Decision-making details documented in ED Course.  Risk Prescription drug  management. Decision regarding hospitalization.  Critical Care Total time providing critical care: 60 minutes (BIPAP initiated by me )    Final Clinical Impression(s) / ED Diagnoses Final diagnoses:  Mild intermittent asthma with exacerbation   The patient appears reasonably stabilized for admission considering the current resources, flow, and capabilities available in the ED at this time, and I doubt any other [facility] requiring further screening and/or treatment in the ED prior to admission.  Rx / DC Orders ED Discharge Orders     None         Yojan Paskett, MD 01/07/23 [phone]

## 2023-01-08 ENCOUNTER — Telehealth: Payer: Self-pay | Admitting: Family Medicine

## 2023-01-08 DIAGNOSIS — J309 Allergic rhinitis, unspecified: Secondary | ICD-10-CM | POA: Diagnosis not present

## 2023-01-08 DIAGNOSIS — G8929 Other chronic pain: Secondary | ICD-10-CM | POA: Diagnosis present

## 2023-01-08 DIAGNOSIS — J4521 Mild intermittent asthma with (acute) exacerbation: Secondary | ICD-10-CM | POA: Diagnosis present

## 2023-01-08 DIAGNOSIS — Z6841 Body Mass Index (BMI) 40.0 and over, adult: Secondary | ICD-10-CM | POA: Diagnosis not present

## 2023-01-08 DIAGNOSIS — F17210 Nicotine dependence, cigarettes, uncomplicated: Secondary | ICD-10-CM | POA: Diagnosis present

## 2023-01-08 DIAGNOSIS — R400 Somnolence: Secondary | ICD-10-CM | POA: Diagnosis not present

## 2023-01-08 DIAGNOSIS — F952 Tourette's disorder: Secondary | ICD-10-CM | POA: Diagnosis present

## 2023-01-08 DIAGNOSIS — T380X5A Adverse effect of glucocorticoids and synthetic analogues, initial encounter: Secondary | ICD-10-CM | POA: Diagnosis not present

## 2023-01-08 DIAGNOSIS — Z1152 Encounter for screening for COVID-19: Secondary | ICD-10-CM | POA: Diagnosis not present

## 2023-01-08 DIAGNOSIS — J9602 Acute respiratory failure with hypercapnia: Secondary | ICD-10-CM | POA: Diagnosis present

## 2023-01-08 DIAGNOSIS — D696 Thrombocytopenia, unspecified: Secondary | ICD-10-CM | POA: Diagnosis present

## 2023-01-08 DIAGNOSIS — E662 Morbid (severe) obesity with alveolar hypoventilation: Secondary | ICD-10-CM | POA: Diagnosis present

## 2023-01-08 DIAGNOSIS — J9601 Acute respiratory failure with hypoxia: Secondary | ICD-10-CM | POA: Diagnosis present

## 2023-01-08 DIAGNOSIS — R109 Unspecified abdominal pain: Secondary | ICD-10-CM | POA: Diagnosis present

## 2023-01-08 DIAGNOSIS — G4733 Obstructive sleep apnea (adult) (pediatric): Secondary | ICD-10-CM

## 2023-01-08 DIAGNOSIS — D721 Eosinophilia, unspecified: Secondary | ICD-10-CM | POA: Diagnosis present

## 2023-01-08 DIAGNOSIS — G4710 Hypersomnia, unspecified: Secondary | ICD-10-CM | POA: Diagnosis present

## 2023-01-08 DIAGNOSIS — J45901 Unspecified asthma with (acute) exacerbation: Secondary | ICD-10-CM | POA: Diagnosis not present

## 2023-01-08 DIAGNOSIS — R000 Tachycardia, unspecified: Secondary | ICD-10-CM | POA: Diagnosis present

## 2023-01-08 DIAGNOSIS — F909 Attention-deficit hyperactivity disorder, unspecified type: Secondary | ICD-10-CM | POA: Diagnosis present

## 2023-01-08 DIAGNOSIS — E66813 Obesity, class 3: Secondary | ICD-10-CM | POA: Diagnosis present

## 2023-01-08 LAB — RENAL FUNCTION PANEL
Albumin: 3.7 g/dL (ref 3.5–5.0)
Anion gap: 10 (ref 5–15)
BUN: 18 mg/dL (ref 6–20)
CO2: 23 mmol/L (ref 22–32)
Calcium: 9 mg/dL (ref 8.9–10.3)
Chloride: 102 mmol/L (ref 98–111)
Creatinine, Ser: 1.21 mg/dL (ref 0.61–1.24)
GFR, Estimated: >60 mL/min (ref >60)
Glucose, Bld: 133 mg/dL — ABNORMAL HIGH (ref 70–99)
Phosphorus: 1.9 mg/dL — ABNORMAL LOW (ref 2.5–4.6)
Potassium: 4.4 mmol/L (ref 3.5–5.1)
Sodium: 135 mmol/L (ref 135–145)

## 2023-01-08 LAB — CBC
HCT: 49.5 % (ref 39.0–52.0)
Hemoglobin: 15.8 g/dL (ref 13.0–17.0)
MCH: 26.4 pg (ref 26.0–34.0)
MCHC: 31.9 g/dL (ref 30.0–36.0)
MCV: 82.8 fL (ref 80.0–100.0)
Platelets: 174 10*3/uL (ref 150–400)
RBC: 5.98 MIL/uL — ABNORMAL HIGH (ref 4.22–5.81)
RDW: 13.5 % (ref 11.5–15.5)
WBC: 17.4 10*3/uL — ABNORMAL HIGH (ref 4.0–10.5)
nRBC: 0 % (ref 0.0–0.2)

## 2023-01-08 LAB — MAGNESIUM: Magnesium: 2.3 mg/dL (ref 1.7–2.4)

## 2023-01-08 NOTE — Plan of Care (Signed)

## 2023-01-08 NOTE — Progress Notes (Signed)
Patient with morbid obesity here with acute hypoxic hypercapnic respiratory failure.  He has witnessed apneas, loud snoring, gasping during the sleep and frequent desaturation to low 80s every time he falls asleep.  He is otherwise saturating in 90s when awake.  Notable compensated hypercarbia on VBG.  He has upcoming appointment with pulmonology for sleep study on 01/24/2023.  He has clinical OSA and possible OHS on my evaluation based on the above.  Patient would benefit from non-invasive ventilation at least until he has formal evaluation.  Without this therapy, the patient is at high risk of ending up with worsening symptoms, worsened respiratory failure, need for ER visits and/or recurrent hospitalizations.

## 2023-01-08 NOTE — Telephone Encounter (Signed)
fyi

## 2023-01-08 NOTE — Progress Notes (Signed)
PROGRESS NOTE  Caleb Kent ZOX:[id number] DOB: Jul 26, 1995   PCP: Nelwyn Salisbury, MD  Patient is from: Home.  Lives with mother.  Independently ambulates at baseline.  DOA: 01/07/2023 LOS: 0  Chief complaints Chief Complaint  Patient presents with   Wheezing     Brief Narrative / Interim history: 27 y.o. male with PMH of childhood asthma and Tourette's syndrome, allergic rhinitis, chronic abdominal pain, ADHD, hypersomnolence, morbid obesity and tobacco use disorder presenting with apnea, paroxysmal dyspnea and hypoxemia.  There was concern about sleep apnea/possible OHS.  He was referred to pulmonology by PCP and has upcoming appointment on 01/24/2023.  CXR without acute finding.  COVID-19, influenza, RSV and full RVP panel nonreactive.  Patient was started on Solu-Medrol, nebulizers and BiPAP and admitted.  Patient tends to snore and desaturate to 70s and 80s when he falls asleep.  Saturation in 90s on room air while awake.  Subjective: Seen and examined earlier this morning.  No major events overnight of this morning.  Episodes of desaturation to 82% last night and this morning when he fell asleep.  He says he feels better when he uses BiPAP.  Denies shortness of breath while awake.  Objective: Vitals:   01/08/23 1100 01/08/23 1102 01/08/23 1105 01/08/23 1148  BP:      Pulse:  94  77  Resp:    19  Temp:      TempSrc:      SpO2: (!) 82%  98%   Weight:      Height:        Examination:  GENERAL: No apparent distress.  Nontoxic. HEENT: MMM.  Vision and hearing grossly intact.  Some nasal congestion NECK: Supple.  No apparent JVD.  RESP:  No IWOB.  Fair aeration bilaterally but limited exam due to body habitus. CVS:  RRR. Heart sounds normal.  ABD/GI/GU: BS+. Abd soft, NTND.  MSK/EXT:  Moves extremities. No apparent deformity. No edema.  SKIN: no apparent skin lesion or wound NEURO: Awake, alert and oriented appropriately.  No apparent focal neuro  deficit. PSYCH: Calm. Normal affect.   Procedures:  None  Microbiology summarized: COVID-19, influenza and RSV PCR nonreactive Full RVP panel nonreactive  Assessment and plan: Principal Problem:   Acute asthma exacerbation Active Problems:   ADHD   Allergic rhinitis   Daytime somnolence   Morbid obesity (HCC)   Acute respiratory failure with hypoxia and hypercapnia (HCC)  Acute respiratory failure with hypoxia and hypercapnia: POA.  Most likely due to OSA and possible OHS.  Reportedly wheezing on presentation raising concern for asthma exacerbation as well.  He has childhood asthma.   BMI is 45.61.  VBG 7.31/57/48/29.Frequently desaturates to low 80s when he for sleep.  Loud snoring.  Saturation in 90s when awake but briefly desaturated to 88% with ambulation.  Infectious workup unrevealing. -BiPAP as needed.  Patient needs NIV for home use.  TOC consulted. -Continue Solu-Medrol -Brovana and Pulmicort with as needed DuoNebs -Consult dietitian   Possible acute asthma exacerbation: History of childhood asthma and allergic rhinitis.  Smokes cigarettes.  Reportedly short of breath and wheezing on arrival.  -Management as above -Encouraged smoking cessation -Antihistamine and Flonase spray for allergies   Allergic rhinitis -Claritin and Flonase nose spray   Daytime somnolence: Works night shift. -Hold phentermine.   Elevated creatinine: Does not meet criteria for AKI.  Improved. Recent Labs    07/18/22 2343 01/07/23 0454 01/08/23 0234  BUN 17 16 18   CREATININE 1.24  1.36* 1.21  -Monitor  Mild thrombocytopenia: Resolved.   Morbid obesity Body mass index is 45.61 kg/m. -Consult dietitian         DVT prophylaxis:  Subcu Lovenox  Code Status: Full code Family Communication: Updated patient's mother and father at bedside Level of care: Progressive Status is: Observation The patient will require care spanning > 2 midnights and should be moved to inpatient because:  Acute respiratory failure with hypoxia and hypercapnia likely due to OSA/OHS, and possible asthma exacerbation   Final disposition: Home Consultants:  None  55 minutes with more than 50% spent in reviewing records, counseling patient/family and coordinating care.   Sch Meds:  Scheduled Meds:  arformoterol  15 mcg Nebulization BID   budesonide (PULMICORT) nebulizer solution  0.5 mg Nebulization BID   enoxaparin (LOVENOX) injection  70 mg Subcutaneous Q24H   influenza vac split trivalent PF  0.5 mL Intramuscular Tomorrow-1000   loratadine  10 mg Oral Daily   [START ON 01/09/2023] predniSONE  40 mg Oral Q breakfast   Continuous Infusions: PRN Meds:.acetaminophen **OR** acetaminophen, ipratropium-albuterol, ondansetron **OR** ondansetron (ZOFRAN) IV, polyethylene glycol, senna-docusate  Antimicrobials: Anti-infectives (From admission, onward)    None        I have personally reviewed the following labs and images: CBC: Recent Labs  Lab 01/07/23 0454 01/07/23 0527 01/08/23 0234  WBC 11.7*  --  17.4*  NEUTROABS 7.6  --   --   HGB 15.3 16.7 15.8  HCT 49.3 49.0 49.5  MCV 85.0  --  82.8  PLT 147*  --  174   BMP &GFR Recent Labs  Lab 01/07/23 0454 01/07/23 0527 01/08/23 0234  NA 138 140 135  K 3.9 3.7 4.4  CL 104  --  102  CO2 24  --  23  GLUCOSE 98  --  133*  BUN 16  --  18  CREATININE 1.36*  --  1.21  CALCIUM 8.7*  --  9.0  MG  --   --  2.3  PHOS  --   --  1.9*   Estimated Creatinine Clearance: 122.8 mL/min (by C-G formula based on SCr of 1.21 mg/dL). Liver & Pancreas: Recent Labs  Lab 01/08/23 0234  ALBUMIN 3.7   No results for input(s): "LIPASE", "AMYLASE" in the last 168 hours. No results for input(s): "AMMONIA" in the last 168 hours. Diabetic: No results for input(s): "HGBA1C" in the last 72 hours. No results for input(s): "GLUCAP" in the last 168 hours. Cardiac Enzymes: No results for input(s): "CKTOTAL", "CKMB", "CKMBINDEX", "TROPONINI" in the  last 168 hours. No results for input(s): "PROBNP" in the last 8760 hours. Coagulation Profile: No results for input(s): "INR", "PROTIME" in the last 168 hours. Thyroid Function Tests: No results for input(s): "TSH", "T4TOTAL", "FREET4", "T3FREE", "THYROIDAB" in the last 72 hours. Lipid Profile: No results for input(s): "CHOL", "HDL", "LDLCALC", "TRIG", "CHOLHDL", "LDLDIRECT" in the last 72 hours. Anemia Panel: No results for input(s): "VITAMINB12", "FOLATE", "FERRITIN", "TIBC", "IRON", "RETICCTPCT" in the last 72 hours. Urine analysis:    Component Value Date/Time   COLORURINE YELLOW 07/18/2022 2307   APPEARANCEUR CLEAR 07/18/2022 2307   LABSPEC 1.024 07/18/2022 2307   PHURINE 5.0 07/18/2022 2307   GLUCOSEU NEGATIVE 07/18/2022 2307   HGBUR NEGATIVE 07/18/2022 2307   BILIRUBINUR NEGATIVE 07/18/2022 2307   BILIRUBINUR n 06/01/2016 1037   KETONESUR NEGATIVE 07/18/2022 2307   PROTEINUR 30 (A) 07/18/2022 2307   UROBILINOGEN 0.2 06/01/2016 1037   NITRITE NEGATIVE 07/18/2022 2307  LEUKOCYTESUR TRACE (A) 07/18/2022 2307   Sepsis Labs: Invalid input(s): "PROCALCITONIN", "LACTICIDVEN"  Microbiology: Recent Results (from the past 240 hour(s))  Resp panel by RT-PCR (RSV, Flu A&B, Covid) Anterior Nasal Swab     Status: None   Collection Time: 01/07/23  5:17 AM   Specimen: Anterior Nasal Swab  Result Value Ref Range Status   SARS Coronavirus 2 by RT PCR NEGATIVE NEGATIVE Final   Influenza A by PCR NEGATIVE NEGATIVE Final   Influenza B by PCR NEGATIVE NEGATIVE Final    Comment: (NOTE) The Xpert Xpress SARS-CoV-2/FLU/RSV plus assay is intended as an aid in the diagnosis of influenza from Nasopharyngeal swab specimens and should not be used as a sole basis for treatment. Nasal washings and aspirates are unacceptable for Xpert Xpress SARS-CoV-2/FLU/RSV testing.  Fact Sheet for Patients: BloggerCourse.com  Fact Sheet for Healthcare  Providers: SeriousBroker.it  This test is not yet approved or cleared by the [place] FDA and has been authorized for detection and/or diagnosis of SARS-CoV-2 by FDA under an Emergency Use Authorization (EUA). This EUA will remain in effect (meaning this test can be used) for the duration of the COVID-19 declaration under Section 564(b)(1) of the Act, 21 U.S.C. section 360bbb-3(b)(1), unless the authorization is terminated or revoked.     Resp Syncytial Virus by PCR NEGATIVE NEGATIVE Final    Comment: (NOTE) Fact Sheet for Patients: BloggerCourse.com  Fact Sheet for Healthcare Providers: SeriousBroker.it  This test is not yet approved or cleared by the [place] FDA and has been authorized for detection and/or diagnosis of SARS-CoV-2 by FDA under an Emergency Use Authorization (EUA). This EUA will remain in effect (meaning this test can be used) for the duration of the COVID-19 declaration under Section 564(b)(1) of the Act, 21 U.S.C. section 360bbb-3(b)(1), unless the authorization is terminated or revoked.  Performed at [facility], [address] [place], [place] [address]   Respiratory (~20 pathogens) panel by PCR     Status: None   Collection Time: 01/07/23  8:01 AM   Specimen: Nasopharyngeal Swab; Respiratory  Result Value Ref Range Status   Adenovirus NOT DETECTED NOT DETECTED Final   Coronavirus 229E NOT DETECTED NOT DETECTED Final    Comment: (NOTE) The Coronavirus on the Respiratory Panel, DOES NOT test for the novel  Coronavirus (2019 nCoV)    Coronavirus HKU1 NOT DETECTED NOT DETECTED Final   Coronavirus NL63 NOT DETECTED NOT DETECTED Final   Coronavirus OC43 NOT DETECTED NOT DETECTED Final   Metapneumovirus NOT DETECTED NOT DETECTED Final   Rhinovirus / Enterovirus NOT DETECTED NOT DETECTED Final   Influenza A NOT DETECTED NOT DETECTED Final   Influenza B NOT DETECTED  NOT DETECTED Final   Parainfluenza Virus 1 NOT DETECTED NOT DETECTED Final   Parainfluenza Virus 2 NOT DETECTED NOT DETECTED Final   Parainfluenza Virus 3 NOT DETECTED NOT DETECTED Final   Parainfluenza Virus 4 NOT DETECTED NOT DETECTED Final   Respiratory Syncytial Virus NOT DETECTED NOT DETECTED Final   Bordetella pertussis NOT DETECTED NOT DETECTED Final   Bordetella Parapertussis NOT DETECTED NOT DETECTED Final   Chlamydophila pneumoniae NOT DETECTED NOT DETECTED Final   Mycoplasma pneumoniae NOT DETECTED NOT DETECTED Final    Comment: Performed at [facility], [address], [place], [place] [address]  MRSA Next Gen by PCR, Nasal     Status: None   Collection Time: 01/07/23  4:57 PM   Specimen: Nasal Mucosa; Nasal Swab  Result Value  Ref Range Status   MRSA by PCR Next Gen NOT DETECTED NOT DETECTED Final    Comment: (NOTE) The GeneXpert MRSA Assay (FDA approved for NASAL specimens only), is one component of a comprehensive MRSA colonization surveillance program. It is not intended to diagnose MRSA infection nor to guide or monitor treatment for MRSA infections. Test performance is not FDA approved in patients less than 58 years old. Performed at [facility], [address] [place], [place] [address]     Radiology Studies: No results found.    Reighlynn Swiney T. Meena Barrantes [facility]  If 7PM-7AM, please contact night-coverage [email or web address] 01/08/2023, 1:24 PM

## 2023-01-08 NOTE — Progress Notes (Signed)
Mobility Specialist Progress Note:    01/08/23 0930  Mobility  Activity Ambulated with assistance in hallway;Ambulated with assistance in room;Ambulated with assistance to bathroom  Level of Assistance Standby assist, set-up cues, supervision of patient - no hands on  Assistive Device None  Distance Ambulated (ft) 210 ft  Range of Motion/Exercises Active;All extremities  Activity Response Tolerated well  Mobility Referral Yes  $Mobility charge 1 Mobility  Mobility Specialist Start Time (ACUTE ONLY) 0930  Mobility Specialist Stop Time (ACUTE ONLY) 0945  Mobility Specialist Time Calculation (min) (ACUTE ONLY) 15 min   Pt requested assistance to ambulate to bathroom, mother in room. Tolerated well, audible SOB during session. Pt denies dizziness.  Baseline SpO2 98% on RA.  Pt agreeable to ambulate in hallway, no AD required, SBA for safety. SpO2 88% on RA during ambulation. Took 1 standing rest break to recover, SpO2 99% on RA.  Returned pt to room, all needs met, SpO2 99% on RA at end of session.   Feliciana Rossetti Mobility Specialist Please contact via Special educational needs teacher or  Rehab office at [phone]

## 2023-01-08 NOTE — Progress Notes (Signed)
Pt on room air at this time. BiPAP (servo) on stby at bedside.

## 2023-01-08 NOTE — Progress Notes (Signed)
Nurse requested Mobility Specialist to perform oxygen saturation test with pt which includes removing pt from oxygen both at rest and while ambulating.  Below are the results from that testing.     Patient Saturations on Room Air at Rest = SpO2 98%   Patient Saturations on Room Air while Ambulating = SpO2 88% .  Rested and performed pursed lip breathing for 1 minute with SpO2 at 99%.  Reported results to nurse.   Feliciana Rossetti Mobility Specialist Please contact via Special educational needs teacher or  Rehab office at [phone]

## 2023-01-08 NOTE — Plan of Care (Signed)
  Problem: Clinical Measurements: Goal: Will remain free from infection Outcome: Progressing   Problem: Activity: Goal: Risk for activity intolerance will decrease Outcome: Progressing   

## 2023-01-08 NOTE — Telephone Encounter (Signed)
Wants to make care team aware patient has been inpatient

## 2023-01-09 DIAGNOSIS — R400 Somnolence: Secondary | ICD-10-CM | POA: Diagnosis not present

## 2023-01-09 DIAGNOSIS — J309 Allergic rhinitis, unspecified: Secondary | ICD-10-CM | POA: Diagnosis not present

## 2023-01-09 DIAGNOSIS — J45901 Unspecified asthma with (acute) exacerbation: Secondary | ICD-10-CM | POA: Diagnosis not present

## 2023-01-09 DIAGNOSIS — F909 Attention-deficit hyperactivity disorder, unspecified type: Secondary | ICD-10-CM | POA: Diagnosis not present

## 2023-01-09 LAB — CBC
HCT: 49.1 % (ref 39.0–52.0)
Hemoglobin: 15.4 g/dL (ref 13.0–17.0)
MCH: 27 pg (ref 26.0–34.0)
MCHC: 31.4 g/dL (ref 30.0–36.0)
MCV: 86.1 fL (ref 80.0–100.0)
Platelets: 153 10*3/uL (ref 150–400)
RBC: 5.7 MIL/uL (ref 4.22–5.81)
RDW: 13.7 % (ref 11.5–15.5)
WBC: 16 10*3/uL — ABNORMAL HIGH (ref 4.0–10.5)
nRBC: 0 % (ref 0.0–0.2)

## 2023-01-09 LAB — MAGNESIUM: Magnesium: 2 mg/dL (ref 1.7–2.4)

## 2023-01-09 LAB — TSH: TSH: 4.347 u[IU]/mL (ref 0.350–4.500)

## 2023-01-09 LAB — RENAL FUNCTION PANEL
Albumin: 3.3 g/dL — ABNORMAL LOW (ref 3.5–5.0)
Anion gap: 7 (ref 5–15)
BUN: 16 mg/dL (ref 6–20)
CO2: 28 mmol/L (ref 22–32)
Calcium: 8.7 mg/dL — ABNORMAL LOW (ref 8.9–10.3)
Chloride: 103 mmol/L (ref 98–111)
Creatinine, Ser: 1.08 mg/dL (ref 0.61–1.24)
GFR, Estimated: >60 mL/min (ref >60)
Glucose, Bld: 126 mg/dL — ABNORMAL HIGH (ref 70–99)
Phosphorus: 3.5 mg/dL (ref 2.5–4.6)
Potassium: 4.1 mmol/L (ref 3.5–5.1)
Sodium: 138 mmol/L (ref 135–145)

## 2023-01-09 LAB — HEMOGLOBIN A1C
Hgb A1c MFr Bld: 5.8 % — ABNORMAL HIGH (ref 4.8–5.6)
Mean Plasma Glucose: 119.76 mg/dL

## 2023-01-09 LAB — LIPID PANEL
Cholesterol: 212 mg/dL — ABNORMAL HIGH (ref 0–200)
HDL: 50 mg/dL (ref >40)
LDL Cholesterol: 118 mg/dL — ABNORMAL HIGH (ref 0–99)
Total CHOL/HDL Ratio: 4.2 {ratio}
Triglycerides: 221 mg/dL — ABNORMAL HIGH (ref <150)
VLDL: 44 mg/dL — ABNORMAL HIGH (ref 0–40)

## 2023-01-09 MED ORDER — FLUTICASONE PROPIONATE 50 MCG/ACT NA SUSP
2.0000 | Freq: Every day | NASAL | Status: DC
  Administered 2023-01-09 – 2023-01-12 (×4): 2 via NASAL
  Filled 2023-01-09: qty 16

## 2023-01-09 MED ORDER — MOMETASONE FURO-FORMOTEROL FUM 200-5 MCG/ACT IN AERO
2.0000 | INHALATION_SPRAY | Freq: Two times a day (BID) | RESPIRATORY_TRACT | Status: DC
  Administered 2023-01-09 – 2023-01-12 (×5): 2 via RESPIRATORY_TRACT
  Filled 2023-01-09: qty 8.8

## 2023-01-09 NOTE — Social Work (Signed)
CSW was advised that pt's mother had questions regarding employment and housing. CSW met with pt and parents at bedside. CSW attempted to illicit participation from pt regarding needs. Pt noted it was his mother who had requested the consult. Pt's mother asked if [facility] RN could be arranged as pt was going home with  CPAP. CSW was advised that pt would likely not qualify for a [facility] RN with CPAP or O2 as there will be training here and the company who provides the equipment will have a rep available for questions. CSW also advised that pt's insurance is one the hospital struggles to get services with.  Pt's mother also asked about resources for pt to find a job. She stated that someone in the ED told her the social worker on the unit would have resources. CSW advised for pt to visit his Cytogenetic[employer] or the [employer] for job and resume building resources. Mother is upset there are not more options available and CSW advised that as a hospital we are able to help with crisis resources but CSW doesn't know what pt is qualified to do. Pt then notes that he is able to work on finding a job and does not need further assistance. TOC is available for further needs.

## 2023-01-09 NOTE — TOC Initial Note (Addendum)
Transition of Care [facility]) - Initial/Assessment Note    Patient Details  Name: Caleb Kent MRN: [id number] Date of Birth: [birth year]  Transition of [facility]) CM/SW Contact:    Lawerance Sabal, RN Phone Number: 01/09/2023, 11:47 AM  Clinical Narrative:                  Consult for NIV, spoke w Zollie Beckers from [place] who is able to accept payor source, he will begin processingrequest.  Per CSW patient's mother with whom he lives, was asking for a [facility] RN. I was able to secure [facility] RN through [place].  I have updated attending that he will need [facility] RN order and that NIV is processing.  NIV referral sent through [employer], they state it may take 24 hours to get authorization  Order signed by Dr Alanda Slim and faxed to [facility].   Expected Discharge Plan: Home w Home Health Services Barriers to Discharge: Continued Medical Work up, Equipment Delay   Patient Goals and CMS Choice Patient states their goals for this hospitalization and ongoing recovery are:: to go home          Expected Discharge Plan and Services   Discharge Planning Services: CM Consult   Living arrangements for the past 2 months: Single Family Home                 DME Arranged: NIV DME Agency: [facility] Date DME Agency Contacted: 01/09/23 Time DME Agency Contacted: 1146 Representative spoke with at DME Agency: Zollie Beckers HH Arranged: RN HH Agency: [facility] Date [facility] Agency Contacted: 01/09/23 Time HH Agency Contacted: 1147 Representative spoke with at [facility] Agency: Amy  Prior Living Arrangements/Services Living arrangements for the past 2 months: Single Family Home Lives with:: Parents                   Activities of Daily Living   ADL Screening (condition at time of admission) Independently performs ADLs?: Yes (appropriate for developmental age) Is the patient deaf or have difficulty hearing?: No Does the patient have difficulty seeing, even when wearing glasses/contacts?: No Does the patient have  difficulty concentrating, remembering, or making decisions?: No  Permission Sought/Granted                  Emotional Assessment              Admission diagnosis:  Hypoxia [R09.02] Acute asthma exacerbation [J45.901] Hypoventilation syndrome [R06.89] Acute respiratory failure with hypoxia and hypercapnia (HCC) [J96.01, J96.02] Asthma with status asthmaticus, unspecified asthma severity, unspecified whether persistent [J45.902] Patient Active Problem List   Diagnosis Date Noted   Acute asthma exacerbation 01/07/2023   Acute respiratory failure with hypoxia and hypercapnia (HCC) 01/07/2023   Hypersomnolence 10/05/2022   Loud snoring 10/05/2022   Daytime somnolence 08/22/2022   Morbid obesity (HCC) 08/22/2022   Labral tear of shoulder 07/02/2015   Epigastric pain 07/07/2009   KNEE SPRAIN 05/25/2009   Gilles de la Tourette's syndrome 03/05/2009   ADHD 01/08/2008   Allergic rhinitis 05/15/2007   Asthma 12/26/2006   PCP:  Nelwyn Salisbury, MD Pharmacy:   [id number] - [place], [place] - [address] AT [facility] [address] [place] [address] Phone: [phone] Fax: [phone]  [facility] - [place], [place] - [address] [place] [address] Phone: [phone] Fax: [phone]  [facility] [address], [place] - [address] [place] [address] Phone: [phone] Fax: [phone]     Social Determinants of Health (SDOH) Social History: SDOH Screenings   Food Insecurity: No Food Insecurity (01/07/2023)  Housing: Low Risk  (01/07/2023)  Transportation Needs: No Transportation Needs (01/07/2023)  Utilities: Not At Risk (01/07/2023)  Depression (PHQ2-9): High Risk (08/17/2022)  Tobacco Use: High Risk (01/07/2023)   SDOH Interventions:     Readmission Risk Interventions     No data to display

## 2023-01-09 NOTE — Progress Notes (Signed)
PROGRESS NOTE  Caleb Kent WUJ:[id number] DOB: [birth year]   PCP: Nelwyn Salisbury, MD  Patient is from: Home.  Lives with mother.  Independently ambulates at baseline.  DOA: 01/07/2023 LOS: 1  Chief complaints Chief Complaint  Patient presents with   Wheezing     Brief Narrative / Interim history: 27 y.o. male with PMH of childhood asthma and Tourette's syndrome, allergic rhinitis, chronic abdominal pain, ADHD, hypersomnolence, morbid obesity and tobacco use disorder presenting with apnea, paroxysmal dyspnea and hypoxemia.  There was concern about sleep apnea/possible OHS.  He was referred to pulmonology by PCP and has upcoming appointment on 01/24/2023.  CXR without acute finding.  COVID-19, influenza, RSV and full RVP panel nonreactive.  Patient was started on Solu-Medrol, nebulizers and BiPAP and admitted.  Patient tends to snore and desaturate to 70s and 80s when he falls asleep.  Saturation in 90s on room air while awake. TOC working on home NIV for OSA/possible OHS.  Subjective: Seen and examined earlier this morning.  Patient's mother reports witnessed apneas and gasping for air during sleep.  Feels well this morning.  No shortness of breath, chest pain, or cough.  Eager to go home and go back to work but understands the need to wait on NIV for home use.  Objective: Vitals:   01/08/23 2341 01/09/23 0305 01/09/23 0757 01/09/23 0808  BP: (!) 140/94 129/89 (!) 137/93   Pulse: 82 77  80  Resp: 20 (!) 22 16 (!) 22  Temp: 97.7 F (36.5 C) 98.4 F (36.9 C) 98.6 F (37 C)   TempSrc: Axillary Oral Oral   SpO2: 93% 91% 99% 99%  Weight:      Height:        Examination:  GENERAL: No apparent distress.  Nontoxic. HEENT: MMM.  Vision and hearing grossly intact.  Some nasal congestion NECK: Supple.  No apparent JVD.  RESP:  No IWOB.  Fair aeration bilaterally but limited exam due to body habitus. CVS:  RRR. Heart sounds normal.  ABD/GI/GU: BS+. Abd soft, NTND.   MSK/EXT:  Moves extremities. No apparent deformity. No edema.  SKIN: Acanthosis nigricans. NEURO: Awake, alert and oriented appropriately.  No apparent focal neuro deficit. PSYCH: Calm. Normal affect.   Procedures:  None  Microbiology summarized: COVID-19, influenza and RSV PCR nonreactive Full RVP panel nonreactive  Assessment and plan: Principal Problem:   Acute asthma exacerbation Active Problems:   ADHD   Allergic rhinitis   Daytime somnolence   Morbid obesity (HCC)   Acute respiratory failure with hypoxia and hypercapnia (HCC)  Acute respiratory failure with hypoxia and hypercapnia: POA.  Most likely due to OSA and possible OHS.  Reportedly wheezing on presentation raising concern for asthma exacerbation as well.  He has childhood asthma.   BMI is 45.61.  VBG 7.31/57/48/29.Frequently desaturates to low 80s when he for sleep.  Loud snoring, apnea and gasping for air when he for sleep.   Infectious workup unrevealing. -BiPAP as needed when he sleeps. -TOC working on home NIV and HH RN -Continue prednisone -Change controller nebs to inhalers -DuoNebs as needed -Consult dietitian -Encouraged smoking cessation.   Possible acute asthma exacerbation: History of childhood asthma and allergic rhinitis.  Smokes cigarettes.  Reportedly short of breath and wheezing on arrival.  -Management as above -Encouraged smoking cessation -Antihistamine and Flonase spray for allergies   Allergic rhinitis -Claritin and Flonase nose spray   Daytime somnolence: Works night shift. -Hold phentermine.   Elevated creatinine: Does not  meet criteria for AKI.  Improved. Recent Labs    07/18/22 2343 01/07/23 0454 01/08/23 0234 01/09/23 0212  BUN 17 16 18 16   CREATININE 1.24 1.36* 1.21 1.08  -Monitor  Mild thrombocytopenia: Resolved.  Tobacco use disorder: Reports smoking about half a pack a day. -Encouraged smoking cessation.   Morbid obesity Body mass index is 45.61 kg/m. -Check  A1c, lipid panel and TSH -Consult dietitian         DVT prophylaxis:  Subcu Lovenox  Code Status: Full code Family Communication: Updated patient's mother at bedside. Level of care: Progressive Status is: Inpatient The patient will remain inpatient because: Acute respiratory failure with hypoxia and hypercapnia requiring BiPAP   Final disposition: Home once he has home NIV Consultants:  None  35 minutes with more than 50% spent in reviewing records, counseling patient/family and coordinating care.   Sch Meds:  Scheduled Meds:  enoxaparin (LOVENOX) injection  70 mg Subcutaneous Q24H   fluticasone  2 spray Each Nare Daily   influenza vac split trivalent PF  0.5 mL Intramuscular Tomorrow-1000   loratadine  10 mg Oral Daily   mometasone-formoterol  2 puff Inhalation BID   predniSONE  40 mg Oral Q breakfast   Continuous Infusions: PRN Meds:.acetaminophen **OR** acetaminophen, ipratropium-albuterol, ondansetron **OR** ondansetron (ZOFRAN) IV, polyethylene glycol, senna-docusate  Antimicrobials: Anti-infectives (From admission, onward)    None        I have personally reviewed the following labs and images: CBC: Recent Labs  Lab 01/07/23 0454 01/07/23 0527 01/08/23 0234 01/09/23 0212  WBC 11.7*  --  17.4* 16.0*  NEUTROABS 7.6  --   --   --   HGB 15.3 16.7 15.8 15.4  HCT 49.3 49.0 49.5 49.1  MCV 85.0  --  82.8 86.1  PLT 147*  --  174 153   BMP &GFR Recent Labs  Lab 01/07/23 0454 01/07/23 0527 01/08/23 0234 01/09/23 0212  NA 138 140 135 138  K 3.9 3.7 4.4 4.1  CL 104  --  102 103  CO2 24  --  23 28  GLUCOSE 98  --  133* 126*  BUN 16  --  18 16  CREATININE 1.36*  --  1.21 1.08  CALCIUM 8.7*  --  9.0 8.7*  MG  --   --  2.3 2.0  PHOS  --   --  1.9* 3.5   Estimated Creatinine Clearance: 137.6 mL/min (by C-G formula based on SCr of 1.08 mg/dL). Liver & Pancreas: Recent Labs  Lab 01/08/23 0234 01/09/23 0212  ALBUMIN 3.7 3.3*   No results for  input(s): "LIPASE", "AMYLASE" in the last 168 hours. No results for input(s): "AMMONIA" in the last 168 hours. Diabetic: No results for input(s): "HGBA1C" in the last 72 hours. No results for input(s): "GLUCAP" in the last 168 hours. Cardiac Enzymes: No results for input(s): "CKTOTAL", "CKMB", "CKMBINDEX", "TROPONINI" in the last 168 hours. No results for input(s): "PROBNP" in the last 8760 hours. Coagulation Profile: No results for input(s): "INR", "PROTIME" in the last 168 hours. Thyroid Function Tests: No results for input(s): "TSH", "T4TOTAL", "FREET4", "T3FREE", "THYROIDAB" in the last 72 hours. Lipid Profile: No results for input(s): "CHOL", "HDL", "LDLCALC", "TRIG", "CHOLHDL", "LDLDIRECT" in the last 72 hours. Anemia Panel: No results for input(s): "VITAMINB12", "FOLATE", "FERRITIN", "TIBC", "IRON", "RETICCTPCT" in the last 72 hours. Urine analysis:    Component Value Date/Time   COLORURINE YELLOW 07/18/2022 2307   APPEARANCEUR CLEAR 07/18/2022 2307   LABSPEC 1.024 07/18/2022 2307  PHURINE 5.0 07/18/2022 2307   GLUCOSEU NEGATIVE 07/18/2022 2307   HGBUR NEGATIVE 07/18/2022 2307   BILIRUBINUR NEGATIVE 07/18/2022 2307   BILIRUBINUR n 06/01/2016 1037   KETONESUR NEGATIVE 07/18/2022 2307   PROTEINUR 30 (A) 07/18/2022 2307   UROBILINOGEN 0.2 06/01/2016 1037   NITRITE NEGATIVE 07/18/2022 2307   LEUKOCYTESUR TRACE (A) 07/18/2022 2307   Sepsis Labs: Invalid input(s): "PROCALCITONIN", "LACTICIDVEN"  Microbiology: Recent Results (from the past 240 hour(s))  Resp panel by RT-PCR (RSV, Flu A&B, Covid) Anterior Nasal Swab     Status: None   Collection Time: 01/07/23  5:17 AM   Specimen: Anterior Nasal Swab  Result Value Ref Range Status   SARS Coronavirus 2 by RT PCR NEGATIVE NEGATIVE Final   Influenza A by PCR NEGATIVE NEGATIVE Final   Influenza B by PCR NEGATIVE NEGATIVE Final    Comment: (NOTE) The Xpert Xpress SARS-CoV-2/FLU/RSV plus assay is intended as an aid in the  diagnosis of influenza from Nasopharyngeal swab specimens and should not be used as a sole basis for treatment. Nasal washings and aspirates are unacceptable for Xpert Xpress SARS-CoV-2/FLU/RSV testing.  Fact Sheet for Patients: BloggerCourse.com  Fact Sheet for Healthcare Providers: SeriousBroker.it  This test is not yet approved or cleared by the [place] FDA and has been authorized for detection and/or diagnosis of SARS-CoV-2 by FDA under an Emergency Use Authorization (EUA). This EUA will remain in effect (meaning this test can be used) for the duration of the COVID-19 declaration under Section 564(b)(1) of the Act, 21 U.S.C. section 360bbb-3(b)(1), unless the authorization is terminated or revoked.     Resp Syncytial Virus by PCR NEGATIVE NEGATIVE Final    Comment: (NOTE) Fact Sheet for Patients: BloggerCourse.com  Fact Sheet for Healthcare Providers: SeriousBroker.it  This test is not yet approved or cleared by the [place] FDA and has been authorized for detection and/or diagnosis of SARS-CoV-2 by FDA under an Emergency Use Authorization (EUA). This EUA will remain in effect (meaning this test can be used) for the duration of the COVID-19 declaration under Section 564(b)(1) of the Act, 21 U.S.C. section 360bbb-3(b)(1), unless the authorization is terminated or revoked.  Performed at [facility], [address] [place], [place] [address]   Respiratory (~20 pathogens) panel by PCR     Status: None   Collection Time: 01/07/23  8:01 AM   Specimen: Nasopharyngeal Swab; Respiratory  Result Value Ref Range Status   Adenovirus NOT DETECTED NOT DETECTED Final   Coronavirus 229E NOT DETECTED NOT DETECTED Final    Comment: (NOTE) The Coronavirus on the Respiratory Panel, DOES NOT test for the novel  Coronavirus (2019 nCoV)    Coronavirus HKU1 NOT DETECTED  NOT DETECTED Final   Coronavirus NL63 NOT DETECTED NOT DETECTED Final   Coronavirus OC43 NOT DETECTED NOT DETECTED Final   Metapneumovirus NOT DETECTED NOT DETECTED Final   Rhinovirus / Enterovirus NOT DETECTED NOT DETECTED Final   Influenza A NOT DETECTED NOT DETECTED Final   Influenza B NOT DETECTED NOT DETECTED Final   Parainfluenza Virus 1 NOT DETECTED NOT DETECTED Final   Parainfluenza Virus 2 NOT DETECTED NOT DETECTED Final   Parainfluenza Virus 3 NOT DETECTED NOT DETECTED Final   Parainfluenza Virus 4 NOT DETECTED NOT DETECTED Final   Respiratory Syncytial Virus NOT DETECTED NOT DETECTED Final   Bordetella pertussis NOT DETECTED NOT DETECTED Final   Bordetella Parapertussis NOT DETECTED NOT DETECTED Final   Chlamydophila pneumoniae NOT DETECTED NOT DETECTED Final  Mycoplasma pneumoniae NOT DETECTED NOT DETECTED Final    Comment: Performed at [facility], [address] [place], [place] [address]  MRSA Next Gen by PCR, Nasal     Status: None   Collection Time: 01/07/23  4:57 PM   Specimen: Nasal Mucosa; Nasal Swab  Result Value Ref Range Status   MRSA by PCR Next Gen NOT DETECTED NOT DETECTED Final    Comment: (NOTE) The GeneXpert MRSA Assay (FDA approved for NASAL specimens only), is one component of a comprehensive MRSA colonization surveillance program. It is not intended to diagnose MRSA infection nor to guide or monitor treatment for MRSA infections. Test performance is not FDA approved in patients less than 9 years old. Performed at [facility], [address], [place], [place] [address]     Radiology Studies: No results found.    Babygirl Trager T. Cache Decoursey [facility]  If 7PM-7AM, please contact night-coverage [email or web address] 01/09/2023, 2:12 PM

## 2023-01-09 NOTE — Progress Notes (Signed)
Patient continues to exhibit signs of hypercapnia associated with chronic respiratory failure secondary to severe OHS. Severe and lufe threatening disease state. Patient requires the use of NIPPV both at sleep and in the daytime to help with exacerbation periods. The use of the NIPPV will treat both the patients high PCO2 levels and can reduce the risk of exacerbations and future hospitalizations when used at night and during the day. The patient will need these advanced settings in conjunction with their current medication regimen; BIPAP is not an option due to its functional limitations and the severity of the patient's condition. The features that the NIPPV exhibits is the best modality for patient care and could aid with increasing the patient's exertional tolerance while reducing acute respiratory exacerbations and distress. Failure to have NIPPV available for use over a 24 hour period could lead to death.  Patient is able to clear airway and manage secretions.

## 2023-01-09 NOTE — Progress Notes (Signed)
Found pt vaping on his room, he denied it first. Girlfriend was at bedside. Vape was confiscated and placed at the pt chart. Per pt "that is all I have," Educated pt and the girlfriend.  Pt is in room air sats at 93%.  -care ongoing.

## 2023-01-09 NOTE — Plan of Care (Signed)
Brief Nutrition Note  RD consulted for nutrition education regarding weight loss.  Body mass index is 45.61 kg/m. Pt meets criteria for obesity class III based on current BMI.  RD has attached "General, Healthful Nutrition Therapy" handout from the Academy of Nutrition and Dietetics to pt's AVS/Discharge Instructions. RD has also placed referral to outpatient dietitian for further diet education. Handout in AVS emphasizes the importance of serving sizes and provides examples of correct portions of common foods. Handout discusses importance of controlled and consistent intake throughout the day. Handout provides examples of ways to balance meals/snacks and encouraged intake of high-fiber, whole grain complex carbohydrates. Handout emphasizes the importance of hydration with calorie-free beverages and limiting sugar-sweetened beverages. Handout encourages pt to discuss physical activity options with physician.  Current diet order is Regular. Pt is consuming approximately 100% of meals at this time. Labs and medications reviewed. No further nutrition interventions warranted at this time. If additional nutrition issues arise, please re-consult RD.   Mertie Clause, MS, RD, LDN Registered Dietitian II Please see AMiON for contact information.

## 2023-01-09 NOTE — Progress Notes (Signed)
Mobility Specialist Progress Note:    01/09/23 0911  Mobility  Activity Ambulated with assistance in hallway  Level of Assistance Standby assist, set-up cues, supervision of patient - no hands on  Assistive Device None  Distance Ambulated (ft) 440 ft  Range of Motion/Exercises Active;All extremities  Activity Response Tolerated well  Mobility Referral Yes  $Mobility charge 1 Mobility  Mobility Specialist Start Time (ACUTE ONLY) 0900  Mobility Specialist Stop Time (ACUTE ONLY) 0910  Mobility Specialist Time Calculation (min) (ACUTE ONLY) 10 min   Pt received EOB, eager for mobility session. Ambulated in hallways, no AD required. Tolerated well, audible SOB throughout. SpO2 91-95% on RA throughout session. Returned pt to room, all needs met, nurse notified.   Feliciana Rossetti Mobility Specialist Please contact via Special educational needs teacher or  Rehab office at [phone]

## 2023-01-09 NOTE — Discharge Instructions (Signed)
General, Healthful Nutrition Therapy  This handout provides you with the information you'll need to follow a general, healthful diet, which can be tailored to your personal preferences. There are several benefits to following a general, healthful diet: It could mean less calories, less salt, less added sugars, and less saturated fat than many other diets. This outcome will depend on the foods you choose. Eating more whole grains, beans, lentils, fruits, vegetables, nuts, and seeds may improve how much fiber, vitamins, and minerals you eat.  It can lower your risk for health conditions like diabetes, heart disease, hypertension, stroke, and cancer. Your registered dietitian nutritionist (RDN) may recommend portion sizes based on your individual needs and personal and cultural preferences.  Tips Every day, eat a variety of fruits and vegetables in a variety of colors. Be sure to include lots of dark green, red, blue-purple, and orange vegetables. Choose whole grains for at least half of your grain selections. Eat more beans, peas, and lentils. Try meatless alternatives. Get protein in your diet from eggs, fish, poultry, beans, peas, lentils, and nuts/nut butters. Low-fat or fat-free dairy products are also good sources of protein. Keep your salt intake to a minimum (less than 2300 milligrams per day). Limit use of salt, soy sauce, or fish sauce when cooking. Eat freshly prepared meals at home. Processed, prepackaged, and restaurant foods contain more salt. Choose fresh fruits and vegetables for snacks. Choose products with lower sodium content when grocery shopping. Limit your daily sugar intake. Sugar may be used in sauces, marinades, dressings, and condiments - even those that do not taste sweet.  Sugar can be found in honey, syrups, jelly, fruit juice, and fruit juice concentrate. Limit sugar-sweetened beverages like sodas and fruit juice, sugary snacks, and candy. It's best to choose  products without added sugar, but if you do eat them, read labels carefully so you know how much sugar is in each portion. It is better to eat unsaturated fats than saturated fats. Use fats and oils in moderation, up to 5 servings per day. Unsaturated fat is found in fish, avocado, nuts, and oils like sunflower, canola, avocado and olive oils. Saturated fat is found in fatty meat, butter, ice cream, palm and coconut oil, cream, cheese, and lard. Many processed foods, fried foods, fast food items, convenience foods like frozen pizza and snack foods, and sweets including pies, cookies, and other pastries are high in fat. Check nutrition labels and choose these foods less often. Use vegetable oil instead of lard or butter for cooking. Boil, steam, or bake your food instead of deep frying in oil. Remove the fatty part of meats before cooking.  Foods to Choose or to Limit Choose a healthful balance of foods from each food group at your meals. Your RDN may make individualized portion size recommendations based on your needs. Food Group Foods to Choose Foods to Limit  Grains Whole wheat, barley, rye, buckwheat, corn, teff, quinoa, millet, amaranth, brown and wild rice, sorghum, and oats; focus on intact cooked whole grains Grain products, such as bread, rolls, prepared breakfast cereals, crackers, and pasta made from whole grains that are low in added sugars, saturated fat, and sodium Sweetened, low-fiber breakfast cereals Packaged (high sugar, refined ingredients) baked goods Snack crackers and chips made of refined ingredients, cheese crackers, butter crackers Breads made with refined ingredients and saturated fats, such as biscuits, frozen waffles, sweet breads, doughnuts, pastries, packaged baking mixes, pancakes, cakes, and cookies  Protein Foods Meat, including lean, trimmed cuts of   beef, pork, or lamb a few times per week or less Poultry, including skinless chicken or turkey Seafood, including  fish, shrimp, lobster, clams, and scallops at least twice per week. Focus on fatty fish, such as salmon, herring, and sardines, as a rich source of omega-3 fatty acids Eggs Nuts and seeds, such as peanuts, almonds, pistachios, and sunflower seeds (unsalted varieties) Nut and seed butters, such as peanut butter, almond butter, and sunflower seed butter (reduced-sodium varieties)  Soy foods such as tofu, tempeh, or soy nuts Plant protein-based meat alternatives, such as veggie burgers and sausages (reduced-sodium varieties) Unsalted beans, lentils, or peas at least a few times per week in place of other protein sources Marbled or fatty red meats (beef, pork, lamb), such as ribs Processed red meats, such as bacon, sausage, and ham Poultry (chicken and turkey) with skin Fried meats, poultry, or fish King mackerel, shark, and tilefish (may contain high levels of mercury) Deli meats, such as pastrami, bologna, or salami Fried eggs Salted beans, peas, lentils, nuts, seeds, or nut/seed butters Meat alternatives with high levels of sodium or saturated fat  Dairy and Dairy Alternatives Low-fat or fat-free milk, yogurt (low in added sugars), cottage cheese, and cheeses Fortified soymilk or soy yogurt Whole milk, cream, cheeses made from whole milk, sour cream Yogurt or ice cream made from whole milk or with added sugar Cream cheese made from whole milk  Vegetables A variety of vegetables, including dark-green, red, blue-purple, and orange vegetables Low-sodium vegetable juices Canned or frozen vegetables with salt, fresh vegetables prepared with salt Fried vegetables Vegetables in cream sauce or cheese sauce Tomato or pasta sauce with high levels of salt or sugar  Fruit A variety of whole fruits, canned fruit packed in water, or dried fruit 100% fruit juice (limited to  cup per day) Fruits packed in syrup or made with added sugar  Fats and Oils Unsaturated vegetable oils, including olive, peanut, and  canola oils Vegetable oil-based margarines and spreads Salad dressing and mayonnaise made from unsaturated vegetable oils Solid shortening or partially hydrogenated oils Solid margarine made with hydrogenated or partially hydrogenated oils Butter  Beverages Coffee and tea (unsweetened) Water Sweetened drinks, including sweetened coffee or tea drinks, soda, energy drinks, and sports drinks  Other Prepared foods, including soups, casseroles, salads, baked goods, and snacks made from recommended ingredients, with low levels of added saturated fat, added sugars, or salt Sugary and/or fatty desserts, candy, and other sweets; salt and seasonings that contain salt Fried foods   General, Healthful Diet Sample 1-Day Menu View Nutrient Info Breakfast 1 cup oatmeal  cup blueberries 1 ounce almonds 1 cup 1% milk or fortified soymilk 1 cup unsweetened coffee  Lunch 2 slices whole wheat bread 3 ounces turkey slices 2 lettuce leaves 2 slices tomato 1 ounce reduced-fat, reduced sodium cheese  cup carrot sticks  cup hummus 1 banana 1 cup 1% milk or fortified soymilk 1 cup unsweetened tea  Evening Meal 4 ounces salmon, baked  cup cooked brown rice 1 cup green beans, cooked 1 cup mixed greens salad 1 teaspoon olive oil mixed with vinegar of choice 1 whole wheat dinner roll 1 teaspoon margarine, soft, tub (for roll) 1 cup water  Evening Snack 1 cup low-fat yogurt  cup sliced peaches    

## 2023-01-09 NOTE — Progress Notes (Signed)
Nurse requested Mobility Specialist to perform oxygen saturation test with pt which includes removing pt from oxygen both at rest and while ambulating.   Below are the results from that testing.   Patient Saturations on Room Air at Rest = spO2 91%   Patient Saturations on Room Air while Ambulating = sp02 95% .  Feliciana Rossetti Mobility Specialist Please contact via Special educational needs teacher or  Rehab office at [phone]

## 2023-01-10 DIAGNOSIS — J4521 Mild intermittent asthma with (acute) exacerbation: Secondary | ICD-10-CM | POA: Diagnosis not present

## 2023-01-10 DIAGNOSIS — R400 Somnolence: Secondary | ICD-10-CM | POA: Diagnosis not present

## 2023-01-10 DIAGNOSIS — J9601 Acute respiratory failure with hypoxia: Secondary | ICD-10-CM | POA: Diagnosis not present

## 2023-01-10 NOTE — Progress Notes (Addendum)
PROGRESS NOTE  AADIN LATON UJW:[id number] DOB: 07-Dec-1995   PCP: Nelwyn Salisbury, MD  Patient is from: Home.  Lives with mother.  Independently ambulates at baseline.  DOA: 01/07/2023 LOS: 2  Chief complaints Chief Complaint  Patient presents with   Wheezing     Brief Narrative / Interim history: 27 y.o. male with PMH of childhood asthma and Tourette's syndrome, allergic rhinitis, chronic abdominal pain, ADHD, hypersomnolence, morbid obesity and tobacco use disorder presenting with apnea, paroxysmal dyspnea and hypoxemia.  There was concern about sleep apnea/possible OHS.  He was referred to pulmonology by PCP and has upcoming appointment on 01/24/2023.  CXR without acute finding.  COVID-19, influenza, RSV and full RVP panel nonreactive.  Patient was started on Solu-Medrol, nebulizers and BiPAP and admitted.  Patient tends to snore and desaturate to 70s and 80s when he falls asleep.  Saturation in 90s on room air while awake. TOC working on home NIV for OSA/possible OHS.  Subjective: -His adoptive mother is at bedside -No new concerns -Staying awake a bit more -Sadly was vaping in the room  Objective: Vitals:   01/10/23 0806 01/10/23 0807 01/10/23 1100 01/10/23 1500  BP: (!) 127/93  133/88 (!) 143/92  Pulse:   90 92  Resp: (!) 28 17 20  (!) 30  Temp:   98.6 F (37 C) 98.5 F (36.9 C)  TempSrc:   Oral Oral  SpO2:   91% 92%  Weight:      Height:       Physical Exam  Gen:- Awake Alert, in no acute distress , morbidly obese HEENT:- Francis.AT, No sclera icterus Neck-Supple Neck,No JVD,.  Lungs-air movement is not great, no wheezing CV- S1, S2 normal, RRR Abd-  +ve B.Sounds, Abd Soft, No tenderness, increased truncal adiposity    Extremity/Skin:- No  edema,   good pedal pulses  Psych-affect is appropriate, oriented x3 Neuro-no new focal deficits, no tremors  Procedures:  None  Microbiology summarized: COVID-19, influenza and RSV PCR nonreactive Full RVP  panel nonreactive  Assessment and plan: Principal Problem:   Acute asthma exacerbation Active Problems:   ADHD   Allergic rhinitis   Daytime somnolence   Morbid obesity (HCC)   Acute respiratory failure with hypoxia and hypercapnia (HCC)  Acute respiratory failure with hypoxia and hypercapnia: POA.   Most likely due to OSA and presumed OHS.   -Patient is a smoker, reportedly wheezing on presentation raising concern for asthma exacerbation as well.  He has childhood asthma.   BMI is 45.61.  VBG 7.31/57/48/29.Frequently desaturates to low 80s when he for sleep.  Loud snoring, apnea and gasping for air when he for sleep.   Infectious workup unrevealing. -BiPAP as needed when he sleeps. -TOC working on home NIV and HH RN -Continue prednisone -Change controller nebs to inhalers -DuoNebs as needed -Consult dietitian -Encouraged smoking cessation. -Steroid-induced leukocytosis noted   Possible acute asthma exacerbation: History of childhood asthma and allergic rhinitis.  Smokes cigarettes.  Reportedly short of breath and wheezing on arrival.  -Management as above -Encouraged smoking cessation -Antihistamine and Flonase spray for allergies   Allergic rhinitis -Claritin and Flonase nose spray   Daytime somnolence: Works night shift. -Hold phentermine.    Tobacco use disorder: Reports smoking about half a pack a day. -Sadly patient was vaping in the hospital room -Encouraged smoking cessation.   Morbid Obesity- -Low calorie diet, portion control and increase physical activity discussed with patient -Body mass index is 45.61 kg/m.   DVT  prophylaxis:  Subcu Lovenox  Code Status: Full code Family Communication: Updated patient's mother at bedside. Level of care: Progressive Status is: Inpatient The patient will remain inpatient because: Acute respiratory failure with hypoxia and hypercapnia requiring BiPAP   Final disposition: Home once he has home NIV (awaiting NIV machine  for discharge home)    Sch Meds:  Scheduled Meds:  enoxaparin (LOVENOX) injection  70 mg Subcutaneous Q24H   fluticasone  2 spray Each Nare Daily   influenza vac split trivalent PF  0.5 mL Intramuscular Tomorrow-1000   loratadine  10 mg Oral Daily   mometasone-formoterol  2 puff Inhalation BID   predniSONE  40 mg Oral Q breakfast   Continuous Infusions: PRN Meds:.acetaminophen **OR** acetaminophen, ipratropium-albuterol, ondansetron **OR** ondansetron (ZOFRAN) IV, polyethylene glycol, senna-docusate  Antimicrobials: Anti-infectives (From admission, onward)    None      I have personally reviewed the following labs and images: CBC: Recent Labs  Lab 01/07/23 0454 01/07/23 0527 01/08/23 0234 01/09/23 0212  WBC 11.7*  --  17.4* 16.0*  NEUTROABS 7.6  --   --   --   HGB 15.3 16.7 15.8 15.4  HCT 49.3 49.0 49.5 49.1  MCV 85.0  --  82.8 86.1  PLT 147*  --  174 153   BMP &GFR Recent Labs  Lab 01/07/23 0454 01/07/23 0527 01/08/23 0234 01/09/23 0212  NA 138 140 135 138  K 3.9 3.7 4.4 4.1  CL 104  --  102 103  CO2 24  --  23 28  GLUCOSE 98  --  133* 126*  BUN 16  --  18 16  CREATININE 1.36*  --  1.21 1.08  CALCIUM 8.7*  --  9.0 8.7*  MG  --   --  2.3 2.0  PHOS  --   --  1.9* 3.5   Estimated Creatinine Clearance: 137.6 mL/min (by C-G formula based on SCr of 1.08 mg/dL). Liver & Pancreas: Recent Labs  Lab 01/08/23 0234 01/09/23 0212  ALBUMIN 3.7 3.3*   Diabetic: Recent Labs    01/09/23 0211  HGBA1C 5.8*   Thyroid Function Tests: Recent Labs    01/09/23 0211  TSH 4.347   Lipid Profile: Recent Labs    01/09/23 0211  CHOL 212*  HDL 50  LDLCALC 118*  TRIG 221*  CHOLHDL 4.2   Urine analysis:    Component Value Date/Time   COLORURINE YELLOW 07/18/2022 2307   APPEARANCEUR CLEAR 07/18/2022 2307   LABSPEC 1.024 07/18/2022 2307   PHURINE 5.0 07/18/2022 2307   GLUCOSEU NEGATIVE 07/18/2022 2307   HGBUR NEGATIVE 07/18/2022 2307   BILIRUBINUR NEGATIVE  07/18/2022 2307   BILIRUBINUR n 06/01/2016 1037   KETONESUR NEGATIVE 07/18/2022 2307   PROTEINUR 30 (A) 07/18/2022 2307   UROBILINOGEN 0.2 06/01/2016 1037   NITRITE NEGATIVE 07/18/2022 2307   LEUKOCYTESUR TRACE (A) 07/18/2022 2307   Microbiology: Recent Results (from the past 240 hour(s))  Resp panel by RT-PCR (RSV, Flu A&B, Covid) Anterior Nasal Swab     Status: None   Collection Time: 01/07/23  5:17 AM   Specimen: Anterior Nasal Swab  Result Value Ref Range Status   SARS Coronavirus 2 by RT PCR NEGATIVE NEGATIVE Final   Influenza A by PCR NEGATIVE NEGATIVE Final   Influenza B by PCR NEGATIVE NEGATIVE Final    Comment: (NOTE) The Xpert Xpress SARS-CoV-2/FLU/RSV plus assay is intended as an aid in the diagnosis of influenza from Nasopharyngeal swab specimens and should not be used as  a sole basis for treatment. Nasal washings and aspirates are unacceptable for Xpert Xpress SARS-CoV-2/FLU/RSV testing.  Fact Sheet for Patients: BloggerCourse.com  Fact Sheet for Healthcare Providers: SeriousBroker.it  This test is not yet approved or cleared by the [place] FDA and has been authorized for detection and/or diagnosis of SARS-CoV-2 by FDA under an Emergency Use Authorization (EUA). This EUA will remain in effect (meaning this test can be used) for the duration of the COVID-19 declaration under Section 564(b)(1) of the Act, 21 U.S.C. section 360bbb-3(b)(1), unless the authorization is terminated or revoked.     Resp Syncytial Virus by PCR NEGATIVE NEGATIVE Final    Comment: (NOTE) Fact Sheet for Patients: BloggerCourse.com  Fact Sheet for Healthcare Providers: SeriousBroker.it  This test is not yet approved or cleared by the [place] FDA and has been authorized for detection and/or diagnosis of SARS-CoV-2 by FDA under an Emergency Use Authorization (EUA). This EUA will  remain in effect (meaning this test can be used) for the duration of the COVID-19 declaration under Section 564(b)(1) of the Act, 21 U.S.C. section 360bbb-3(b)(1), unless the authorization is terminated or revoked.  Performed at [facility], [address] [place], [place] [address]   Respiratory (~20 pathogens) panel by PCR     Status: None   Collection Time: 01/07/23  8:01 AM   Specimen: Nasopharyngeal Swab; Respiratory  Result Value Ref Range Status   Adenovirus NOT DETECTED NOT DETECTED Final   Coronavirus 229E NOT DETECTED NOT DETECTED Final    Comment: (NOTE) The Coronavirus on the Respiratory Panel, DOES NOT test for the novel  Coronavirus (2019 nCoV)    Coronavirus HKU1 NOT DETECTED NOT DETECTED Final   Coronavirus NL63 NOT DETECTED NOT DETECTED Final   Coronavirus OC43 NOT DETECTED NOT DETECTED Final   Metapneumovirus NOT DETECTED NOT DETECTED Final   Rhinovirus / Enterovirus NOT DETECTED NOT DETECTED Final   Influenza A NOT DETECTED NOT DETECTED Final   Influenza B NOT DETECTED NOT DETECTED Final   Parainfluenza Virus 1 NOT DETECTED NOT DETECTED Final   Parainfluenza Virus 2 NOT DETECTED NOT DETECTED Final   Parainfluenza Virus 3 NOT DETECTED NOT DETECTED Final   Parainfluenza Virus 4 NOT DETECTED NOT DETECTED Final   Respiratory Syncytial Virus NOT DETECTED NOT DETECTED Final   Bordetella pertussis NOT DETECTED NOT DETECTED Final   Bordetella Parapertussis NOT DETECTED NOT DETECTED Final   Chlamydophila pneumoniae NOT DETECTED NOT DETECTED Final   Mycoplasma pneumoniae NOT DETECTED NOT DETECTED Final    Comment: Performed at [facility], [address], [place], [place] [address]  MRSA Next Gen by PCR, Nasal     Status: None   Collection Time: 01/07/23  4:57 PM   Specimen: Nasal Mucosa; Nasal Swab  Result Value Ref Range Status   MRSA by PCR Next Gen NOT DETECTED NOT DETECTED Final    Comment: (NOTE) The GeneXpert MRSA Assay (FDA approved for NASAL  specimens only), is one component of a comprehensive MRSA colonization surveillance program. It is not intended to diagnose MRSA infection nor to guide or monitor treatment for MRSA infections. Test performance is not FDA approved in patients less than 50 years old. Performed at [facility], [address], [place], [place] [address]    Shon Hale, MD  [facility]  If 7PM-7AM, please contact night-coverage [email or web address] 01/10/2023, 7:56 PM

## 2023-01-10 NOTE — Plan of Care (Signed)
  Problem: Clinical Measurements: Goal: Will remain free from infection Outcome: Progressing   Problem: Activity: Goal: Risk for activity intolerance will decrease Outcome: Progressing   Problem: Nutrition: Goal: Adequate nutrition will be maintained Outcome: Progressing   Problem: Coping: Goal: Level of anxiety will decrease Outcome: Progressing   Problem: Elimination: Goal: Will not experience complications related to urinary retention Outcome: Progressing   Problem: Safety: Goal: Ability to remain free from injury will improve Outcome: Progressing   Problem: Skin Integrity: Goal: Risk for impaired skin integrity will decrease Outcome: Progressing

## 2023-01-11 DIAGNOSIS — J45901 Unspecified asthma with (acute) exacerbation: Secondary | ICD-10-CM | POA: Diagnosis not present

## 2023-01-11 NOTE — Progress Notes (Signed)
   01/11/23 0327  Vent Select  Invasive or Noninvasive Noninvasive  Adult Vent Y  Adult Ventilator Settings  Vent Type Servo i  Vent Mode BIPAP;PCV  Set Rate 12 bmp  FiO2 (%) 40 %  Pressure Control 10 cmH20  PEEP 10 cmH20  Adult Ventilator Measurements  Peak Airway Pressure 20 L/min  Mean Airway Pressure 15 cmH20  Resp Rate Spontaneous 21 br/min  Exhaled Vt 804 mL  Measured Ve 14.5 L  I:E Ratio Measured 1:2  Auto PEEP 0 cmH20  Total PEEP 10 cmH20  Adult Ventilator Alarms  Alarms On Y  Ve High Alarm 35 L/min  Ve Low Alarm 5 L/min  Resp Rate High Alarm 40 br/min  Resp Rate Low Alarm 8  PEEP Low Alarm 2 cmH2O  Press High Alarm 32 cmH2O  VAP Prevention  HOB> 30 Degrees Y  Breath Sounds  Bilateral Breath Sounds Clear;Diminished  R Upper  Breath Sounds Clear  L Upper Breath Sounds Clear  R Lower Breath Sounds Clear;Diminished  L Lower Breath Sounds Diminished;Clear  Vent Respiratory Assessment  Respiratory Pattern Regular;Unlabored

## 2023-01-11 NOTE — Progress Notes (Signed)
Mobility Specialist Progress Note:    01/11/23 1606  Mobility  Activity Ambulated with assistance in hallway  Level of Assistance Contact guard assist, steadying assist  Assistive Device None  Distance Ambulated (ft) 200 ft  Range of Motion/Exercises Active;All extremities  Activity Response Tolerated well  Mobility Referral Yes  $Mobility charge 1 Mobility  Mobility Specialist Start Time (ACUTE ONLY) 1600  Mobility Specialist Stop Time (ACUTE ONLY) 1606  Mobility Specialist Time Calculation (min) (ACUTE ONLY) 6 min   Pt eager for mobility session. Tolerated well, audible SOB throughout, SpO2 93-95% on RA during session. Returned pt to room, all needs met.    Feliciana Rossetti Mobility Specialist Please contact via Special educational needs teacher or  Rehab office at [phone]

## 2023-01-11 NOTE — TOC Progression Note (Signed)
Transition of Care [facility]) - Progression Note    Patient Details  Name: Caleb Kent MRN: [id number] Date of Birth: 17-Oct-1995  [facility] CM/SW Contact  Leone Haven, RN Phone Number: 01/11/2023, 11:04 AM  Clinical Narrative:    NCM spoke with Zollie Beckers with [facility] , checking on auth for NIV, he states it is still pending.   Expected Discharge Plan: Home w Home Health Services Barriers to Discharge: Continued Medical Work up, Equipment Delay  Expected Discharge Plan and Services   Discharge Planning Services: CM Consult   Living arrangements for the past 2 months: Single Family Home                 DME Arranged: NIV DME Agency: [facility] Date DME Agency Contacted: 01/09/23 Time DME Agency Contacted: 1146 Representative spoke with at DME Agency: Michail Jewels Arranged: RN [facility] Agency: Iantha Fallen [facility] Date [facility] Agency Contacted: 01/09/23 Time HH Agency Contacted: 1147 Representative spoke with at [facility] Agency: Amy   Social Determinants of Health (SDOH) Interventions SDOH Screenings   Food Insecurity: No Food Insecurity (01/07/2023)  Housing: Low Risk  (01/07/2023)  Transportation Needs: No Transportation Needs (01/07/2023)  Utilities: Not At Risk (01/07/2023)  Depression (PHQ2-9): High Risk (08/17/2022)  Tobacco Use: High Risk (01/07/2023)    Readmission Risk Interventions     No data to display

## 2023-01-11 NOTE — Plan of Care (Signed)
  Problem: Activity: Goal: Risk for activity intolerance will decrease Outcome: Progressing   Problem: Nutrition: Goal: Adequate nutrition will be maintained Outcome: Progressing   Problem: Coping: Goal: Level of anxiety will decrease Outcome: Progressing   Problem: Elimination: Goal: Will not experience complications related to urinary retention Outcome: Progressing   Problem: Safety: Goal: Ability to remain free from injury will improve Outcome: Progressing   Problem: Skin Integrity: Goal: Risk for impaired skin integrity will decrease Outcome: Progressing   

## 2023-01-11 NOTE — Progress Notes (Signed)
PROGRESS NOTE    Caleb Kent  KGM:[id number] DOB: 1995-11-07 DOA: 01/07/2023 PCP: Nelwyn Salisbury, MD   Brief Narrative:  27 y.o. male with PMH of childhood asthma and Tourette's syndrome, allergic rhinitis, chronic abdominal pain, ADHD, hypersomnolence, morbid obesity and tobacco use disorder presenting with apnea, paroxysmal dyspnea and hypoxemia.  There was concern about sleep apnea/possible OHS.  He was referred to pulmonology by PCP and has upcoming appointment on 01/24/2023.  CXR without acute finding.  COVID-19, influenza, RSV and full RVP panel nonreactive.  Patient was started on Solu-Medrol, nebulizers and BiPAP and admitted.   Patient tends to snore and desaturate to 70s and 80s when he falls asleep.  Saturation in 90s on room air while awake. TOC working on home NIV for OSA/possible OHS.  Assessment & Plan:   Principal Problem:   Acute asthma exacerbation Active Problems:   ADHD   Allergic rhinitis   Daytime somnolence   Morbid obesity (HCC)   Acute respiratory failure with hypoxia and hypercapnia (HCC)  Acute respiratory failure with hypoxia and hypercapnia: POA.   Most likely due to OSA and presumed OHS.   -Patient is a smoker, reportedly wheezing on presentation raising concern for asthma exacerbation as well.  He has childhood asthma.   BMI is 45.61.  VBG 7.31/57/48/29.Frequently desaturates to low 80s when he for sleep.  Loud snoring, apnea and gasping for air when he for sleep.   Infectious workup unrevealing. -BiPAP as needed when he sleeps. -TOC working on home NIV and HH RN -Continue prednisone, bronchodilators.   Possible acute asthma exacerbation: History of childhood asthma and allergic rhinitis.  Smokes cigarettes.  Reportedly short of breath and wheezing on arrival.  -Management as above   Allergic rhinitis -Claritin and Flonase nose spray   Daytime somnolence: Works night shift. -Hold phentermine.   Tobacco use disorder: Reports smoking  about half a pack a day. -Sadly patient was no to be vaping in the hospital room by previous hospitalist. -Encouraged smoking cessation.   Morbid Obesity- -Low calorie diet, portion control and increase physical activity discussed with patient -Body mass index is 45.61 kg/m.  DVT prophylaxis: Lovenox   Code Status: Full Code  Family Communication:  None present at bedside.  Plan of care discussed with patient in length and he/she verbalized understanding and agreed with it.  Status is: Inpatient Remains inpatient appropriate because: Medically stable and ready for discharge, waiting for NIV to be delivered.  Timing unknown.   Estimated body mass index is 45.61 kg/m as calculated from the following:   Height as of this encounter: 5\' 8"  (1.727 m).   Weight as of this encounter: 136.1 kg.    Nutritional Assessment: Body mass index is 45.61 kg/m.Marland Kitchen Seen by dietician.  I agree with the assessment and plan as outlined below: Nutrition Status:        . Skin Assessment: I have examined the patient's skin and I agree with the wound assessment as performed by the wound care RN as outlined below:    Consultants:  None  Procedures:  None  Antimicrobials:  Anti-infectives (From admission, onward)    None         Subjective: Patient seen and examined.  He has no complaints.  He is eager to leave since he is missing out on the work.  Objective: Vitals:   01/11/23 0337 01/11/23 0804 01/11/23 0829 01/11/23 1129  BP: (!) 141/95 (!) 150/98 (!) 150/98   Pulse: 74 77 74 84  Resp: 20  (!) 24 20  Temp: 97.6 F (36.4 C) 97.7 F (36.5 C)    TempSrc: Axillary Oral    SpO2: 97%  97% 95%  Weight:      Height:       No intake or output data in the 24 hours ending 01/11/23 1229 Filed Weights   01/07/23 0717  Weight: 136.1 kg    Examination:  General exam: Appears calm and comfortable, morbidly obese Respiratory system: Clear to auscultation. Respiratory effort  normal. Cardiovascular system: S1 & S2 heard, RRR. No JVD, murmurs, rubs, gallops or clicks. No pedal edema. Gastrointestinal system: Abdomen is nondistended, soft and nontender. No organomegaly or masses felt. Normal bowel sounds heard. Central nervous system: Alert and oriented. No focal neurological deficits. Extremities: Symmetric 5 x 5 power. Skin: No rashes, lesions or ulcers Psychiatry: Judgement and insight appear normal. Mood & affect appropriate.    Data Reviewed: I have personally reviewed following labs and imaging studies  CBC: Recent Labs  Lab 01/07/23 0454 01/07/23 0527 01/08/23 0234 01/09/23 0212  WBC 11.7*  --  17.4* 16.0*  NEUTROABS 7.6  --   --   --   HGB 15.3 16.7 15.8 15.4  HCT 49.3 49.0 49.5 49.1  MCV 85.0  --  82.8 86.1  PLT 147*  --  174 153   Basic Metabolic Panel: Recent Labs  Lab 01/07/23 0454 01/07/23 0527 01/08/23 0234 01/09/23 0212  NA 138 140 135 138  K 3.9 3.7 4.4 4.1  CL 104  --  102 103  CO2 24  --  23 28  GLUCOSE 98  --  133* 126*  BUN 16  --  18 16  CREATININE 1.36*  --  1.21 1.08  CALCIUM 8.7*  --  9.0 8.7*  MG  --   --  2.3 2.0  PHOS  --   --  1.9* 3.5   GFR: Estimated Creatinine Clearance: 137.6 mL/min (by C-G formula based on SCr of 1.08 mg/dL). Liver Function Tests: Recent Labs  Lab 01/08/23 0234 01/09/23 0212  ALBUMIN 3.7 3.3*   No results for input(s): "LIPASE", "AMYLASE" in the last 168 hours. No results for input(s): "AMMONIA" in the last 168 hours. Coagulation Profile: No results for input(s): "INR", "PROTIME" in the last 168 hours. Cardiac Enzymes: No results for input(s): "CKTOTAL", "CKMB", "CKMBINDEX", "TROPONINI" in the last 168 hours. BNP (last 3 results) No results for input(s): "PROBNP" in the last 8760 hours. HbA1C: Recent Labs    01/09/23 0211  HGBA1C 5.8*   CBG: No results for input(s): "GLUCAP" in the last 168 hours. Lipid Profile: Recent Labs    01/09/23 0211  CHOL 212*  HDL 50  LDLCALC  118*  TRIG 221*  CHOLHDL 4.2   Thyroid Function Tests: Recent Labs    01/09/23 0211  TSH 4.347   Anemia Panel: No results for input(s): "VITAMINB12", "FOLATE", "FERRITIN", "TIBC", "IRON", "RETICCTPCT" in the last 72 hours. Sepsis Labs: Recent Labs  Lab 01/07/23 8657  LATICACIDVEN 0.9    Recent Results (from the past 240 hour(s))  Resp panel by RT-PCR (RSV, Flu A&B, Covid) Anterior Nasal Swab     Status: None   Collection Time: 01/07/23  5:17 AM   Specimen: Anterior Nasal Swab  Result Value Ref Range Status   SARS Coronavirus 2 by RT PCR NEGATIVE NEGATIVE Final   Influenza A by PCR NEGATIVE NEGATIVE Final   Influenza B by PCR NEGATIVE NEGATIVE Final    Comment: (NOTE)  The Xpert Xpress SARS-CoV-2/FLU/RSV plus assay is intended as an aid in the diagnosis of influenza from Nasopharyngeal swab specimens and should not be used as a sole basis for treatment. Nasal washings and aspirates are unacceptable for Xpert Xpress SARS-CoV-2/FLU/RSV testing.  Fact Sheet for Patients: BloggerCourse.com  Fact Sheet for Healthcare Providers: SeriousBroker.it  This test is not yet approved or cleared by the [place] FDA and has been authorized for detection and/or diagnosis of SARS-CoV-2 by FDA under an Emergency Use Authorization (EUA). This EUA will remain in effect (meaning this test can be used) for the duration of the COVID-19 declaration under Section 564(b)(1) of the Act, 21 U.S.C. section 360bbb-3(b)(1), unless the authorization is terminated or revoked.     Resp Syncytial Virus by PCR NEGATIVE NEGATIVE Final    Comment: (NOTE) Fact Sheet for Patients: BloggerCourse.com  Fact Sheet for Healthcare Providers: SeriousBroker.it  This test is not yet approved or cleared by the [place] FDA and has been authorized for detection and/or diagnosis of SARS-CoV-2 by FDA under  an Emergency Use Authorization (EUA). This EUA will remain in effect (meaning this test can be used) for the duration of the COVID-19 declaration under Section 564(b)(1) of the Act, 21 U.S.C. section 360bbb-3(b)(1), unless the authorization is terminated or revoked.  Performed at [facility], [address] [place], [place] [address]   Respiratory (~20 pathogens) panel by PCR     Status: None   Collection Time: 01/07/23  8:01 AM   Specimen: Nasopharyngeal Swab; Respiratory  Result Value Ref Range Status   Adenovirus NOT DETECTED NOT DETECTED Final   Coronavirus 229E NOT DETECTED NOT DETECTED Final    Comment: (NOTE) The Coronavirus on the Respiratory Panel, DOES NOT test for the novel  Coronavirus (2019 nCoV)    Coronavirus HKU1 NOT DETECTED NOT DETECTED Final   Coronavirus NL63 NOT DETECTED NOT DETECTED Final   Coronavirus OC43 NOT DETECTED NOT DETECTED Final   Metapneumovirus NOT DETECTED NOT DETECTED Final   Rhinovirus / Enterovirus NOT DETECTED NOT DETECTED Final   Influenza A NOT DETECTED NOT DETECTED Final   Influenza B NOT DETECTED NOT DETECTED Final   Parainfluenza Virus 1 NOT DETECTED NOT DETECTED Final   Parainfluenza Virus 2 NOT DETECTED NOT DETECTED Final   Parainfluenza Virus 3 NOT DETECTED NOT DETECTED Final   Parainfluenza Virus 4 NOT DETECTED NOT DETECTED Final   Respiratory Syncytial Virus NOT DETECTED NOT DETECTED Final   Bordetella pertussis NOT DETECTED NOT DETECTED Final   Bordetella Parapertussis NOT DETECTED NOT DETECTED Final   Chlamydophila pneumoniae NOT DETECTED NOT DETECTED Final   Mycoplasma pneumoniae NOT DETECTED NOT DETECTED Final    Comment: Performed at [facility], [address] [place], [place] [address]  MRSA Next Gen by PCR, Nasal     Status: None   Collection Time: 01/07/23  4:57 PM   Specimen: Nasal Mucosa; Nasal Swab  Result Value Ref Range Status   MRSA by PCR Next Gen NOT DETECTED NOT DETECTED Final    Comment:  (NOTE) The GeneXpert MRSA Assay (FDA approved for NASAL specimens only), is one component of a comprehensive MRSA colonization surveillance program. It is not intended to diagnose MRSA infection nor to guide or monitor treatment for MRSA infections. Test performance is not FDA approved in patients less than 41 years old. Performed at [facility], [address], [place], [place] [address]      Radiology Studies: No results found.  Scheduled Meds:  enoxaparin (LOVENOX) injection  70 mg Subcutaneous Q24H   fluticasone  2 spray Each Nare Daily   influenza vac split trivalent PF  0.5 mL Intramuscular Tomorrow-1000   loratadine  10 mg Oral Daily   mometasone-formoterol  2 puff Inhalation BID   predniSONE  40 mg Oral Q breakfast   Continuous Infusions:   LOS: 3 days   Hughie Closs, MD [facility]  01/11/2023, 12:29 PM   *Please note that this is a verbal dictation therefore any spelling or grammatical errors are due to the "Dragon Medical One" system interpretation.  Please page via Amion and do not message via secure chat for urgent patient care matters. Secure chat can be used for non urgent patient care matters.  How to contact the [facility] Attending or Consulting provider 7A - 7P or covering provider during after hours 7P -7A, for this patient?  Check the care team in [facility] and look for a) attending/consulting TRH provider listed and b) the [facility] team listed. Page or secure chat 7A-7P. Log into [email or web address] and use [place]'s universal password to access. If you do not have the password, please contact the hospital operator. Locate the [facility] provider you are looking for under [facility] and page to a number that you can be directly reached. If you still have difficulty reaching the provider, please page the [facility] (Director on Call) for the Hospitalists listed on amion for assistance.

## 2023-01-11 NOTE — Plan of Care (Signed)

## 2023-01-12 DIAGNOSIS — J45901 Unspecified asthma with (acute) exacerbation: Secondary | ICD-10-CM | POA: Diagnosis not present

## 2023-01-12 NOTE — Plan of Care (Signed)

## 2023-01-12 NOTE — TOC Progression Note (Signed)
Transition of Care [facility]) - Progression Note    Patient Details  Name: Caleb Kent MRN: [id number] Date of Birth: 01/01/1996  [facility]) CM/SW Contact  Leone Haven, RN Phone Number: 01/12/2023, 12:11 PM  Clinical Narrative:    NCM spoke with [facility], she states they will send the resp therapist to patient room today to do teaching with him , so he can go home with the NIV, which the auth is still pending, but will send order for bipap STA for doc to sign in case NIV is denied, they will send in for bipap STA.     Expected Discharge Plan: Home w Home Health Services Barriers to Discharge: Continued Medical Work up, Equipment Delay  Expected Discharge Plan and Services   Discharge Planning Services: CM Consult   Living arrangements for the past 2 months: Single Family Home                 DME Arranged: NIV DME Agency: [facility] Date DME Agency Contacted: 01/09/23 Time DME Agency Contacted: 1146 Representative spoke with at DME Agency: Michail Jewels Arranged: RN [facility] Agency: [facility] Date [facility] Agency Contacted: 01/09/23 Time HH Agency Contacted: 1147 Representative spoke with at [facility] Agency: Amy   Social Determinants of Health (SDOH) Interventions SDOH Screenings   Food Insecurity: No Food Insecurity (01/07/2023)  Housing: Low Risk  (01/07/2023)  Transportation Needs: No Transportation Needs (01/07/2023)  Utilities: Not At Risk (01/07/2023)  Depression (PHQ2-9): High Risk (08/17/2022)  Tobacco Use: High Risk (01/07/2023)    Readmission Risk Interventions     No data to display

## 2023-01-12 NOTE — TOC Transition Note (Addendum)
Transition of Care [facility]) - CM/SW Discharge Note   Patient Details  Name: Caleb Kent MRN: [id number] Date of Birth: [birth year]  Transition of Care [facility]) CM/SW Contact:  Leone Haven, RN Phone Number: 01/12/2023, 1:11 PM   Clinical Narrative:    NCM spoke with [facility] Rep, she states they will send the resp therapist to patient room today to do teaching with him , so he can go home with the NIV, which the auth is still pending, but will send order for bipap STA for doc to sign in case NIV is denied, they will send in for bipap STA.   Patient can be dc after Resp teaching with NIV. They will come this afternoon, did not give a time.  NCM notified Amy with [facility] of dc.     Barriers to Discharge: Continued Medical Work up, Equipment Delay   Patient Goals and CMS Choice      Discharge Placement                         Discharge Plan and Services Additional resources added to the After Visit Summary for     Discharge Planning Services: CM Consult            DME Arranged: NIV DME Agency: [facility] Date DME Agency Contacted: 01/09/23 Time DME Agency Contacted: 1146 Representative spoke with at DME Agency: Michail Jewels Arranged: RN [facility] Agency: [facility] Date [facility] Agency Contacted: 01/09/23 Time HH Agency Contacted: 1147 Representative spoke with at [facility] Agency: Amy  Social Determinants of Health (SDOH) Interventions SDOH Screenings   Food Insecurity: No Food Insecurity (01/07/2023)  Housing: Low Risk  (01/07/2023)  Transportation Needs: No Transportation Needs (01/07/2023)  Utilities: Not At Risk (01/07/2023)  Depression (PHQ2-9): High Risk (08/17/2022)  Tobacco Use: High Risk (01/07/2023)     Readmission Risk Interventions     No data to display

## 2023-01-12 NOTE — Progress Notes (Signed)
Went over AVS paperwork with patient, answered all questions, removed iv, gave patient work note and his confiscated vape, patient was rolled out to family vehicle.

## 2023-01-12 NOTE — Progress Notes (Signed)
Mobility Specialist Progress Note:   01/12/23 1037  Mobility  Activity Ambulated with assistance in hallway  Level of Assistance Contact guard assist, steadying assist  Assistive Device None  Distance Ambulated (ft) 400 ft  Range of Motion/Exercises Active  Activity Response Tolerated well  Mobility Referral Yes  $Mobility charge 1 Mobility  Mobility Specialist Start Time (ACUTE ONLY) 1020  Mobility Specialist Stop Time (ACUTE ONLY) 1036  Mobility Specialist Time Calculation (min) (ACUTE ONLY) 16 min   Pt received in bed, agreeable to mobility session. Tolerated well, VSS throughout, SPO2 90-95% on RA throughout session. Returned pt to room, all needs met.    Feliciana Rossetti Mobility Specialist Please contact via Special educational needs teacher or  Rehab office at [phone]

## 2023-01-12 NOTE — Discharge Summary (Signed)
Physician Discharge Summary  Caleb Kent WUX:[id number] DOB: 09-Sep-1995 DOA: 01/07/2023  PCP: Nelwyn Salisbury, MD  Admit date: 01/07/2023 Discharge date: 01/12/2023 30 Day Unplanned Readmission Risk Score    Flowsheet Row ED to Hosp-Admission (Current) from 01/07/2023 in [place] 2C CV PROGRESSIVE CARE  30 Day Unplanned Readmission Risk Score (%) 8.71 Filed at 01/12/2023 1200       This score is the patient's risk of an unplanned readmission within 30 days of being discharged (0 -100%). The score is based on dignosis, age, lab data, medications, orders, and past utilization.   Low:  0-14.9   Medium: 15-21.9   High: 22-29.9   Extreme: 30 and above          Admitted From: Home Disposition: Home  Recommendations for Outpatient Follow-up:  Follow up with PCP in 1-2 weeks Please obtain BMP/CBC in one week Establish relationship and follow-up with pulmonary as outpatient.  PCP to send the referral. Please follow up with your PCP on the following pending results: Unresulted Labs (From admission, onward)     Start     Ordered   01/14/23 0500  Creatinine, serum  (enoxaparin (LOVENOX)    CrCl >/= 30 ml/min)  Weekly,   R     Comments: while on enoxaparin therapy    01/07/23 1101              Home Health: None Equipment/Devices:NIV or bipap   Discharge Condition: Stable CODE STATUS: Full code Diet recommendation: Regular  Subjective: Seen and examined.  No complaints.  Eager to go home.  Brief/Interim Summary: 27 y.o. male with PMH of childhood asthma and Tourette's syndrome, allergic rhinitis, chronic abdominal pain, ADHD, hypersomnolence, morbid obesity and tobacco use disorder presented with apnea, paroxysmal dyspnea and hypoxemia.  There was concern about sleep apnea/possible OHS.  He was referred to pulmonology by PCP and has upcoming appointment on 01/24/2023.  CXR without acute finding.  COVID-19, influenza, RSV and full RVP panel nonreactive.  Patient was  started on Solu-Medrol, nebulizers and BiPAP and admitted with the diagnosis of Acute respiratory failure with hypoxia and hypercapnia: POA.   Most likely due to OSA and presumed OHS.   -Patient is a smoker, reportedly wheezing on presentation raising concern for asthma exacerbation as well.  He has childhood asthma.   BMI is 45.61.  VBG 7.31/57/48/29.Frequently desaturates to low 80s when he for sleep.  Loud snoring, apnea and gasping for air when he for sleep.   Infectious workup unrevealing. -BiPAP as needed when he sleeps while inpatient.TOC working on home NIV and HH RN.  I was just informed that insurance authorization for NIV is still pending but DME company are going to send the resp therapist to patient room today to go over teaching and send him home with the NIV,  , if the NVI gets denied in the process they can go ahead and get the bipap STA for him,    Possible acute asthma exacerbation: History of childhood asthma and allergic rhinitis.  Smokes cigarettes.  Reportedly short of breath and wheezing on arrival.  Currently only on bronchodilators.  Resume PTA medications.  Currently not wheezy.   Allergic rhinitis -Claritin and Flonase nose spray   Daytime somnolence: Works night shift.  Resume phentermine.   Tobacco use disorder: Reports smoking about half a pack a day. -Sadly patient was noted to be vaping in the hospital room by previous hospitalist. -Encouraged smoking cessation.   Morbid Obesity- -Low calorie diet,  portion control and increase physical activity discussed with patient -Body mass index is 45.61 kg/m.  Discharge plan was discussed with patient and/or family member and they verbalized understanding and agreed with it.  Discharge Diagnoses:  Principal Problem:   Acute asthma exacerbation Active Problems:   ADHD   Allergic rhinitis   Daytime somnolence   Morbid obesity (HCC)   Acute respiratory failure with hypoxia and hypercapnia [facility])    Discharge  Instructions  Discharge Instructions     Amb Referral to Nutrition and Diabetic Education   Complete by: As directed       Allergies as of 01/12/2023   No Known Allergies      Medication List     TAKE these medications    albuterol (2.5 MG/3ML) 0.083% nebulizer solution Commonly known as: PROVENTIL USE 1 VIAL PER NEBULIZER EVERY 4 HOURS AS NEEDED FOR WHEEZING OR SHORTNESS OF BREATH   albuterol 108 (90 Base) MCG/ACT inhaler Commonly known as: ProAir HFA INHALE 2 PUFFS INTO THE LUNGS EVERY 4 HORUS AS NEEDED FOR WHEEZING/SHORTNESS OF BREATH   phentermine 15 MG capsule Take 1 capsule (15 mg total) by mouth every morning.        Follow-up Information     [facility] Follow up.   Why: for home health services Contact information: [address] [place] [address] [phone]         Nelwyn Salisbury, MD Follow up in 1 week(s).   Specialty: Family Medicine Contact information: [address] [place] [address] [phone]                No Known Allergies  Consultations: None   Procedures/Studies: DG Chest 2 View  Result Date: 01/07/2023 CLINICAL DATA:  Shortness of breath and wheezing. EXAM: CHEST - 2 VIEW COMPARISON:  None Available. FINDINGS: The cardiomediastinal silhouette and vascular pattern are normal. Lateral view compromised by motion. The lungs are expiratory. This limits visualization of the lower lung fields. The visualized lungs are clear. The sulci are sharp. No acute osseous findings. Mild lower thoracic kyphosis. IMPRESSION: Expiratory chest with no evidence of acute disease, but limited view of the bases. Lateral view compromised by motion. Electronically Signed   By: Almira Bar M.D.   On: 01/07/2023 01:50     Discharge Exam: Vitals:   01/12/23 0836 01/12/23 1054  BP: 124/76 134/89  Pulse: 72 76  Resp: 20 18  Temp:  97.6 F (36.4 C)  SpO2: 97% 99%   Vitals:   01/12/23 0749 01/12/23  0819 01/12/23 0836 01/12/23 1054  BP: 124/76  124/76 134/89  Pulse: 76  72 76  Resp: 19 19 20 18   Temp: 98.1 F (36.7 C)   97.6 F (36.4 C)  TempSrc: Oral   Axillary  SpO2: 93%  97% 99%  Weight:      Height:        General: Pt is alert, awake, not in acute distress, morbidly obese Cardiovascular: RRR, S1/S2 +, no rubs, no gallops Respiratory: CTA bilaterally, no wheezing, no rhonchi Abdominal: Soft, NT, ND, bowel sounds + Extremities: no edema, no cyanosis    The results of significant diagnostics from this hospitalization (including imaging, microbiology, ancillary and laboratory) are listed below for reference.     Microbiology: Recent Results (from the past 240 hour(s))  Resp panel by RT-PCR (RSV, Flu A&B, Covid) Anterior Nasal Swab     Status: None   Collection Time: 01/07/23  5:17 AM   Specimen: Anterior Nasal Swab  Result Value Ref Range Status   SARS Coronavirus 2 by RT PCR NEGATIVE NEGATIVE Final   Influenza A by PCR NEGATIVE NEGATIVE Final   Influenza B by PCR NEGATIVE NEGATIVE Final    Comment: (NOTE) The Xpert Xpress SARS-CoV-2/FLU/RSV plus assay is intended as an aid in the diagnosis of influenza from Nasopharyngeal swab specimens and should not be used as a sole basis for treatment. Nasal washings and aspirates are unacceptable for Xpert Xpress SARS-CoV-2/FLU/RSV testing.  Fact Sheet for Patients: BloggerCourse.com  Fact Sheet for Healthcare Providers: SeriousBroker.it  This test is not yet approved or cleared by the [place] FDA and has been authorized for detection and/or diagnosis of SARS-CoV-2 by FDA under an Emergency Use Authorization (EUA). This EUA will remain in effect (meaning this test can be used) for the duration of the COVID-19 declaration under Section 564(b)(1) of the Act, 21 U.S.C. section 360bbb-3(b)(1), unless the authorization is terminated or revoked.     Resp Syncytial Virus  by PCR NEGATIVE NEGATIVE Final    Comment: (NOTE) Fact Sheet for Patients: BloggerCourse.com  Fact Sheet for Healthcare Providers: SeriousBroker.it  This test is not yet approved or cleared by the [place] FDA and has been authorized for detection and/or diagnosis of SARS-CoV-2 by FDA under an Emergency Use Authorization (EUA). This EUA will remain in effect (meaning this test can be used) for the duration of the COVID-19 declaration under Section 564(b)(1) of the Act, 21 U.S.C. section 360bbb-3(b)(1), unless the authorization is terminated or revoked.  Performed at [facility], [address] [place], [place] [address]   Respiratory (~20 pathogens) panel by PCR     Status: None   Collection Time: 01/07/23  8:01 AM   Specimen: Nasopharyngeal Swab; Respiratory  Result Value Ref Range Status   Adenovirus NOT DETECTED NOT DETECTED Final   Coronavirus 229E NOT DETECTED NOT DETECTED Final    Comment: (NOTE) The Coronavirus on the Respiratory Panel, DOES NOT test for the novel  Coronavirus (2019 nCoV)    Coronavirus HKU1 NOT DETECTED NOT DETECTED Final   Coronavirus NL63 NOT DETECTED NOT DETECTED Final   Coronavirus OC43 NOT DETECTED NOT DETECTED Final   Metapneumovirus NOT DETECTED NOT DETECTED Final   Rhinovirus / Enterovirus NOT DETECTED NOT DETECTED Final   Influenza A NOT DETECTED NOT DETECTED Final   Influenza B NOT DETECTED NOT DETECTED Final   Parainfluenza Virus 1 NOT DETECTED NOT DETECTED Final   Parainfluenza Virus 2 NOT DETECTED NOT DETECTED Final   Parainfluenza Virus 3 NOT DETECTED NOT DETECTED Final   Parainfluenza Virus 4 NOT DETECTED NOT DETECTED Final   Respiratory Syncytial Virus NOT DETECTED NOT DETECTED Final   Bordetella pertussis NOT DETECTED NOT DETECTED Final   Bordetella Parapertussis NOT DETECTED NOT DETECTED Final   Chlamydophila pneumoniae NOT DETECTED NOT DETECTED Final   Mycoplasma  pneumoniae NOT DETECTED NOT DETECTED Final    Comment: Performed at [facility], [address], [place], [place] [address]  MRSA Next Gen by PCR, Nasal     Status: None   Collection Time: 01/07/23  4:57 PM   Specimen: Nasal Mucosa; Nasal Swab  Result Value Ref Range Status   MRSA by PCR Next Gen NOT DETECTED NOT DETECTED Final    Comment: (NOTE) The GeneXpert MRSA Assay (FDA approved for NASAL specimens only), is one component of a comprehensive MRSA colonization surveillance program. It is not intended to diagnose  MRSA infection nor to guide or monitor treatment for MRSA infections. Test performance is not FDA approved in patients less than 58 years old. Performed at [facility], [address], [place], [place] [address]      Labs: BNP (last 3 results) No results for input(s): "BNP" in the last 8760 hours. Basic Metabolic Panel: Recent Labs  Lab 01/07/23 0454 01/07/23 0527 01/08/23 0234 01/09/23 0212  NA 138 140 135 138  K 3.9 3.7 4.4 4.1  CL 104  --  102 103  CO2 24  --  23 28  GLUCOSE 98  --  133* 126*  BUN 16  --  18 16  CREATININE 1.36*  --  1.21 1.08  CALCIUM 8.7*  --  9.0 8.7*  MG  --   --  2.3 2.0  PHOS  --   --  1.9* 3.5   Liver Function Tests: Recent Labs  Lab 01/08/23 0234 01/09/23 0212  ALBUMIN 3.7 3.3*   No results for input(s): "LIPASE", "AMYLASE" in the last 168 hours. No results for input(s): "AMMONIA" in the last 168 hours. CBC: Recent Labs  Lab 01/07/23 0454 01/07/23 0527 01/08/23 0234 01/09/23 0212  WBC 11.7*  --  17.4* 16.0*  NEUTROABS 7.6  --   --   --   HGB 15.3 16.7 15.8 15.4  HCT 49.3 49.0 49.5 49.1  MCV 85.0  --  82.8 86.1  PLT 147*  --  174 153   Cardiac Enzymes: No results for input(s): "CKTOTAL", "CKMB", "CKMBINDEX", "TROPONINI" in the last 168 hours. BNP: Invalid input(s): "POCBNP" CBG: No results for input(s): "GLUCAP" in the last 168 hours. D-Dimer No results for input(s): "DDIMER" in the last 72  hours. Hgb A1c No results for input(s): "HGBA1C" in the last 72 hours. Lipid Profile No results for input(s): "CHOL", "HDL", "LDLCALC", "TRIG", "CHOLHDL", "LDLDIRECT" in the last 72 hours. Thyroid function studies No results for input(s): "TSH", "T4TOTAL", "T3FREE", "THYROIDAB" in the last 72 hours.  Invalid input(s): "FREET3" Anemia work up No results for input(s): "VITAMINB12", "FOLATE", "FERRITIN", "TIBC", "IRON", "RETICCTPCT" in the last 72 hours. Urinalysis    Component Value Date/Time   COLORURINE YELLOW 07/18/2022 2307   APPEARANCEUR CLEAR 07/18/2022 2307   LABSPEC 1.024 07/18/2022 2307   PHURINE 5.0 07/18/2022 2307   GLUCOSEU NEGATIVE 07/18/2022 2307   HGBUR NEGATIVE 07/18/2022 2307   BILIRUBINUR NEGATIVE 07/18/2022 2307   BILIRUBINUR n 06/01/2016 1037   KETONESUR NEGATIVE 07/18/2022 2307   PROTEINUR 30 (A) 07/18/2022 2307   UROBILINOGEN 0.2 06/01/2016 1037   NITRITE NEGATIVE 07/18/2022 2307   LEUKOCYTESUR TRACE (A) 07/18/2022 2307   Sepsis Labs Recent Labs  Lab 01/07/23 0454 01/08/23 0234 01/09/23 0212  WBC 11.7* 17.4* 16.0*   Microbiology Recent Results (from the past 240 hour(s))  Resp panel by RT-PCR (RSV, Flu A&B, Covid) Anterior Nasal Swab     Status: None   Collection Time: 01/07/23  5:17 AM   Specimen: Anterior Nasal Swab  Result Value Ref Range Status   SARS Coronavirus 2 by RT PCR NEGATIVE NEGATIVE Final   Influenza A by PCR NEGATIVE NEGATIVE Final   Influenza B by PCR NEGATIVE NEGATIVE Final    Comment: (NOTE) The Xpert Xpress SARS-CoV-2/FLU/RSV plus assay is intended as an aid in the diagnosis of influenza from Nasopharyngeal swab specimens and should not be used as a sole basis for treatment. Nasal washings and aspirates are unacceptable for Xpert Xpress SARS-CoV-2/FLU/RSV testing.  Fact Sheet for Patients: BloggerCourse.com  Fact Sheet for Healthcare Providers: SeriousBroker.it  This test  is not yet approved or cleared by the [place] FDA and has been authorized for detection and/or diagnosis of SARS-CoV-2 by FDA under an Emergency Use Authorization (EUA). This EUA will remain in effect (meaning this test can be used) for the duration of the COVID-19 declaration under Section 564(b)(1) of the Act, 21 U.S.C. section 360bbb-3(b)(1), unless the authorization is terminated or revoked.     Resp Syncytial Virus by PCR NEGATIVE NEGATIVE Final    Comment: (NOTE) Fact Sheet for Patients: BloggerCourse.com  Fact Sheet for Healthcare Providers: SeriousBroker.it  This test is not yet approved or cleared by the [place] FDA and has been authorized for detection and/or diagnosis of SARS-CoV-2 by FDA under an Emergency Use Authorization (EUA). This EUA will remain in effect (meaning this test can be used) for the duration of the COVID-19 declaration under Section 564(b)(1) of the Act, 21 U.S.C. section 360bbb-3(b)(1), unless the authorization is terminated or revoked.  Performed at [facility], [address], [place], [place] [address]   Respiratory (~20 pathogens) panel by PCR     Status: None   Collection Time: 01/07/23  8:01 AM   Specimen: Nasopharyngeal Swab; Respiratory  Result Value Ref Range Status   Adenovirus NOT DETECTED NOT DETECTED Final   Coronavirus 229E NOT DETECTED NOT DETECTED Final    Comment: (NOTE) The Coronavirus on the Respiratory Panel, DOES NOT test for the novel  Coronavirus (2019 nCoV)    Coronavirus HKU1 NOT DETECTED NOT DETECTED Final   Coronavirus NL63 NOT DETECTED NOT DETECTED Final   Coronavirus OC43 NOT DETECTED NOT DETECTED Final   Metapneumovirus NOT DETECTED NOT DETECTED Final   Rhinovirus / Enterovirus NOT DETECTED NOT DETECTED Final   Influenza A NOT DETECTED NOT DETECTED Final   Influenza B NOT DETECTED NOT DETECTED Final   Parainfluenza Virus 1 NOT DETECTED NOT  DETECTED Final   Parainfluenza Virus 2 NOT DETECTED NOT DETECTED Final   Parainfluenza Virus 3 NOT DETECTED NOT DETECTED Final   Parainfluenza Virus 4 NOT DETECTED NOT DETECTED Final   Respiratory Syncytial Virus NOT DETECTED NOT DETECTED Final   Bordetella pertussis NOT DETECTED NOT DETECTED Final   Bordetella Parapertussis NOT DETECTED NOT DETECTED Final   Chlamydophila pneumoniae NOT DETECTED NOT DETECTED Final   Mycoplasma pneumoniae NOT DETECTED NOT DETECTED Final    Comment: Performed at [facility], [address] [place], [place] [address]  MRSA Next Gen by PCR, Nasal     Status: None   Collection Time: 01/07/23  4:57 PM   Specimen: Nasal Mucosa; Nasal Swab  Result Value Ref Range Status   MRSA by PCR Next Gen NOT DETECTED NOT DETECTED Final    Comment: (NOTE) The GeneXpert MRSA Assay (FDA approved for NASAL specimens only), is one component of a comprehensive MRSA colonization surveillance program. It is not intended to diagnose MRSA infection nor to guide or monitor treatment for MRSA infections. Test performance is not FDA approved in patients less than 60 years old. Performed at [facility], [address], [place], [place] [address]     FURTHER DISCHARGE INSTRUCTIONS:   Get Medicines reviewed and adjusted: Please take all your medications with you for your next visit with your Primary MD   Laboratory/radiological data: Please request your Primary MD to go over all hospital tests and procedure/radiological results at the follow up, please ask your Primary MD to get all [facility]  records sent to his/her office.   In some cases, they will be blood work, cultures and biopsy results pending at the time of your discharge. Please request that your primary care M.D. goes through all the records of your hospital data and follows up on these results.   Also Note the following: If you experience worsening of your admission symptoms, develop shortness of breath,  life threatening emergency, suicidal or homicidal thoughts you must seek medical attention immediately by calling 911 or calling your MD immediately  if symptoms less severe.   You must read complete instructions/literature along with all the possible adverse reactions/side effects for all the Medicines you take and that have been prescribed to you. Take any new Medicines after you have completely understood and accpet all the possible adverse reactions/side effects.    Do not drive when taking Pain medications or sleeping medications (Benzodaizepines)   Do not take more than prescribed Pain, Sleep and Anxiety Medications. It is not advisable to combine anxiety,sleep and pain medications without talking with your primary care practitioner   Special Instructions: If you have smoked or chewed Tobacco  in the last 2 yrs please stop smoking, stop any regular Alcohol  and or any Recreational drug use.   Wear Seat belts while driving.   Please note: You were cared for by a hospitalist during your hospital stay. Once you are discharged, your primary care physician will handle any further medical issues. Please note that NO REFILLS for any discharge medications will be authorized once you are discharged, as it is imperative that you return to your primary care physician (or establish a relationship with a primary care physician if you do not have one) for your post hospital discharge needs so that they can reassess your need for medications and monitor your lab values  Time coordinating discharge: Over 30 minutes  SIGNED:   Hughie Closs, MD  [facility] 01/12/2023, 12:14 PM *Please note that this is a verbal dictation therefore any spelling or grammatical errors are due to the "Dragon Medical One" system interpretation. If 7PM-7AM, please contact night-coverage [email or web address]

## 2023-01-15 ENCOUNTER — Telehealth: Payer: Self-pay

## 2023-01-15 NOTE — Transitions of Care (Post Inpatient/ED Visit) (Signed)
01/15/2023  Name: ALEE LIVA MRN: [id number] DOB: July 05, 1995  Today's TOC FU Call Status: Today's TOC FU Call Status:: Unsuccessful Call (1st Attempt) Unsuccessful Call (1st Attempt) Date: 01/15/23  Attempted to reach the patient regarding the most recent Inpatient/ED visit.  Follow Up Plan: Additional outreach attempts will be made to reach the patient to complete the Transitions of Care (Post Inpatient/ED visit) call.     Antionette Fairy, RN,BSN,CCM RN Care Manager Transitions of Care  [place]  Direct Phone: [phone] Toll Free: [phone] Fax: [phone]

## 2023-01-16 ENCOUNTER — Telehealth: Payer: Self-pay

## 2023-01-16 NOTE — Transitions of Care (Post Inpatient/ED Visit) (Signed)
01/16/2023  Name: Caleb Kent MRN: [id number] DOB: 04-30-1995  Today's TOC FU Call Status: Today's TOC FU Call Status:: Unsuccessful Call (2nd Attempt) Unsuccessful Call (2nd Attempt) Date: 01/16/23  Attempted to reach the patient regarding the most recent Inpatient/ED visit.  Follow Up Plan: Additional outreach attempts will be made to reach the patient to complete the Transitions of Care (Post Inpatient/ED visit) call.      Antionette Fairy, RN,BSN,CCM RN Care Manager Transitions of Care  [place]  Direct Phone: [phone] Toll Free: [phone] Fax: [phone]

## 2023-01-17 ENCOUNTER — Telehealth: Payer: Self-pay

## 2023-01-17 NOTE — Transitions of Care (Post Inpatient/ED Visit) (Signed)
01/17/2023  Name: Caleb Kent MRN: [id number] DOB: [birth year]  Today's TOC FU Call Status: Today's TOC FU Call Status:: Unsuccessful Call (3rd Attempt) Unsuccessful Call (3rd Attempt) Date: 01/17/23  Attempted to reach the patient regarding the most recent Inpatient/ED visit.  Follow Up Plan: No further outreach attempts will be made at this time. We have been unable to contact the patient.    Antionette Fairy, RN,BSN,CCM RN Care Manager Transitions of Care  [place]  Direct Phone: [phone] Toll Free: [phone] Fax: [phone]

## 2023-01-18 ENCOUNTER — Encounter: Payer: Self-pay | Admitting: Family Medicine

## 2023-01-18 ENCOUNTER — Ambulatory Visit: Payer: 59 | Admitting: Family Medicine

## 2023-01-18 VITALS — BP 126/84 | HR 78 | Temp 98.1°F | Wt 314.0 lb

## 2023-01-18 DIAGNOSIS — F17200 Nicotine dependence, unspecified, uncomplicated: Secondary | ICD-10-CM | POA: Diagnosis not present

## 2023-01-18 DIAGNOSIS — J45901 Unspecified asthma with (acute) exacerbation: Secondary | ICD-10-CM | POA: Diagnosis not present

## 2023-01-18 DIAGNOSIS — J9601 Acute respiratory failure with hypoxia: Secondary | ICD-10-CM

## 2023-01-18 DIAGNOSIS — G4733 Obstructive sleep apnea (adult) (pediatric): Secondary | ICD-10-CM | POA: Diagnosis not present

## 2023-01-18 NOTE — Progress Notes (Signed)
Subjective:    Patient ID: Caleb Kent, male    DOB: November 06, 1995, 27 y.o.   MRN: [id number]  HPI Here for a transitional care follow up of a hospital stay from 01-07-23 to 01-12-23 for acute respiratory failure with hypoxia and hypercapnea. This was likely an acute exacerbation of asthma. He also seems to have significant obstructive sleep apnea. He was observed to snore, have struggles with breathing, and to desaturate his oxygen to the low 80's while asleep. He is already scheduled to see Pulmonology for OSA on 01-24-23. Initially he was treated with IV steroids and nebulizers, then he was switched to BiPAP. He was sent home with a CPAP machine and a full face mask. He has difficulty keeping the mask in place during the night, but he says he already feels more energy. He has been smoking cigarettes and vaping for years, but he says he has stopped the vaping and he has cut his smoking down to one cigarette a day. As far as his asthma, he has an Albuterol rescue inhaler and a nebulizer at home. He has been using the nebulizer an average of once a day.    Review of Systems  Constitutional: Negative.   Respiratory:  Positive for chest tightness and shortness of breath. Negative for cough and wheezing.   Cardiovascular: Negative.        Objective:   Physical Exam Constitutional:      General: He is not in acute distress.    Appearance: He is obese.  Cardiovascular:     Rate and Rhythm: Normal rate and regular rhythm.     Pulses: Normal pulses.     Heart sounds: Normal heart sounds.  Pulmonary:     Effort: Pulmonary effort is normal.     Breath sounds: Normal breath sounds.  Neurological:     Mental Status: He is alert.           Assessment & Plan:  His asthma has resurfaced, and I urged him to quit smoking completely. He can use his nebulizer and inhaler as needed. He also has OSA, and he will see Pulmonology next week. I encouraged him to lose weight.  Gershon Crane,  MD

## 2023-01-24 ENCOUNTER — Encounter: Payer: Self-pay | Admitting: Nurse Practitioner

## 2023-01-24 ENCOUNTER — Ambulatory Visit (INDEPENDENT_AMBULATORY_CARE_PROVIDER_SITE_OTHER): Payer: 59 | Admitting: Nurse Practitioner

## 2023-01-24 VITALS — BP 135/89 | HR 85 | Ht 68.0 in | Wt 318.0 lb

## 2023-01-24 DIAGNOSIS — J9602 Acute respiratory failure with hypercapnia: Secondary | ICD-10-CM

## 2023-01-24 DIAGNOSIS — G4710 Hypersomnia, unspecified: Secondary | ICD-10-CM

## 2023-01-24 DIAGNOSIS — E662 Morbid (severe) obesity with alveolar hypoventilation: Secondary | ICD-10-CM | POA: Diagnosis not present

## 2023-01-24 DIAGNOSIS — R400 Somnolence: Secondary | ICD-10-CM

## 2023-01-24 DIAGNOSIS — J4520 Mild intermittent asthma, uncomplicated: Secondary | ICD-10-CM

## 2023-01-24 DIAGNOSIS — R0683 Snoring: Secondary | ICD-10-CM

## 2023-01-24 DIAGNOSIS — J9601 Acute respiratory failure with hypoxia: Secondary | ICD-10-CM

## 2023-01-24 NOTE — Patient Instructions (Addendum)
Given your symptoms and history, I am concerned that you may have sleep disordered breathing with sleep apnea. You also have a component of hypoventilation syndrome. You will need an in lab sleep study for further evaluation. Someone will contact you to schedule this.   We discussed how untreated sleep apnea puts an individual at risk for cardiac arrhthymias, pulm HTN, DM, stroke and increases their risk for daytime accidents. We also briefly reviewed treatment options including weight loss, side sleeping position, oral appliance, CPAP therapy or referral to ENT for possible surgical options  Use caution when driving and pull over if you become sleepy.  Continue to use your noninvasive ventilator every night, minimum 4-6 hours but you should be wearing it the entire time you sleep I will send orders to see if they can adjust your temperature settings and try a nasal mask instead  Change equipment as directed. Wash your tubing with warm soap and water daily, hang to dry. Wash humidifier portion weekly. Use bottled, distilled water and change daily  Continue albuterol inhaler 2 puffs or 3 mL neb every 6 hours as needed for shortness of breath or wheezing. Notify if symptoms persist despite rescue inhaler/neb use. Let me know if you're having to use this on more of a consistent basis   Follow up in 6-8 weeks with Dr. Wynona Neat (1st) or Katie Ranell Finelli,NP to go over sleep study results, or sooner, if needed

## 2023-01-24 NOTE — Assessment & Plan Note (Signed)
BMI 48. Healthy weight loss encouraged.

## 2023-01-24 NOTE — Assessment & Plan Note (Signed)
He has snoring, excessive daytime sleepiness, nocturnal apneic events, restless sleep. BMI 48. Recent hospitalization for acute respiratory failure with hypoxia and hypercarbia, concerning for OSA/OHS. He is currently on NIV after discharge. He may be able to be transitioned to BiPAP, which would be more cost affordable for him. He has yet to have a sleep study. I have ordered an in lab split night study for further evaluation. He may need separate titration study following this, dependent on study efficiency.    - discussed how weight can impact sleep and risk for sleep disordered breathing - discussed options to assist with weight loss: combination of diet modification, cardiovascular and strength training exercises   - had an extensive discussion regarding the adverse health consequences related to untreated sleep disordered breathing - specifically discussed the risks for hypertension, coronary artery disease, cardiac dysrhythmias, cerebrovascular disease, and diabetes - lifestyle modification discussed   - discussed how sleep disruption can increase risk of accidents, particularly when driving - safe driving practices were discussed  Patient Instructions  Given your symptoms and history, I am concerned that you may have sleep disordered breathing with sleep apnea. You also have a component of hypoventilation syndrome. You will need an in lab sleep study for further evaluation. Someone will contact you to schedule this.   We discussed how untreated sleep apnea puts an individual at risk for cardiac arrhthymias, pulm HTN, DM, stroke and increases their risk for daytime accidents. We also briefly reviewed treatment options including weight loss, side sleeping position, oral appliance, CPAP therapy or referral to ENT for possible surgical options  Use caution when driving and pull over if you become sleepy.  Continue to use your noninvasive ventilator every night, minimum 4-6 hours but you should  be wearing it the entire time you sleep I will send orders to see if they can adjust your temperature settings and try a nasal mask instead  Change equipment as directed. Wash your tubing with warm soap and water daily, hang to dry. Wash humidifier portion weekly. Use bottled, distilled water and change daily  Continue albuterol inhaler 2 puffs or 3 mL neb every 6 hours as needed for shortness of breath or wheezing. Notify if symptoms persist despite rescue inhaler/neb use. Let me know if you're having to use this on more of a consistent basis   Follow up in 6-8 weeks with Dr. Wynona Neat (1st) or Katie Kallum Jorgensen,NP to go over sleep study results, or sooner, if needed

## 2023-01-24 NOTE — Assessment & Plan Note (Signed)
Unclear if he truly had asthma exacerbation based on notes reviewed. He's not had issues with his asthma since childhood. Will monitor him on SABA. If he has another exacerbation or ACT score decreases, will need to controller regimen. Action plan in place.

## 2023-01-24 NOTE — Assessment & Plan Note (Signed)
Resolved daytime O2 requirement. See above. Will assess for ongoing nocturnal needs during in lab study.

## 2023-01-24 NOTE — Progress Notes (Signed)
06/30/1994, 08/24/1994, 11/02/1994, 07/22/1995, 08/04/1998   HIB (PRP-OMP) 06/30/1994, 08/24/1994, 11/02/1994, 07/22/1995   Hepatitis B 1995/02/13, 06/13/1994, 10/19/1994   Influenza Split 12/30/2010, 03/06/2012   Influenza Whole 02/12/2006, 12/23/2008, 12/27/2009   Influenza,inj,Quad PF,6+ Mos 02/26/2013, 01/20/2015, 04/24/2016, 04/12/2017   MMR 05/11/1995, 08/04/1998   Meningococcal Conjugate 05/07/2012   Meningococcal polysaccharide vaccine (MPSV4) 02/08/2010   OPV 06/01/1994, 08/24/1994,  10/09/1994, 08/04/1998   PFIZER(Purple Top)SARS-COV-2 Vaccination 06/30/2019, 07/21/2019   PPD Test 05/09/2012   Td 09/11/2006   Tdap 09/11/2006   Varicella 09/24/1995, 09/11/2006    Past Medical History:  Diagnosis Date   ADHD (attention deficit hyperactivity disorder)    "grew like"   Asthma    Shoulder separation, left, subsequent encounter    sees Dr. Eulah Pont    Strabismus    sees Dr. Maple Hudson   Tourette syndrome    resolved    Tobacco History: Social History   Tobacco Use  Smoking Status Every Day   Current packs/day: 0.20   Types: Cigarettes  Smokeless Tobacco Never   Ready to quit: Not Answered Counseling given: Not Answered   Outpatient Medications Prior to Visit  Medication Sig Dispense Refill   albuterol (PROAIR HFA) 108 (90 Base) MCG/ACT inhaler INHALE 2 PUFFS INTO THE LUNGS EVERY 4 HORUS AS NEEDED FOR WHEEZING/SHORTNESS OF BREATH 8.5 each 3   albuterol (PROVENTIL) (2.5 MG/3ML) 0.083% nebulizer solution USE 1 VIAL PER NEBULIZER EVERY 4 HOURS AS NEEDED FOR WHEEZING OR SHORTNESS OF BREATH 450 mL 3   phentermine 15 MG capsule Take 1 capsule (15 mg total) by mouth every morning. (Patient not taking: Reported on 01/24/2023) 30 capsule 5   No facility-administered medications prior to visit.     Review of Systems:   Constitutional: No weight loss or gain, night sweats, fevers, chills, or lassitude. +fatigue HEENT: No difficulty swallowing, tooth/dental problems, or sore throat. No sneezing, itching, ear ache, nasal congestion, or post nasal drip. +occasional headaches CV:  No chest pain, orthopnea, PND, swelling in lower extremities, anasarca, dizziness, palpitations, syncope Resp: +snoring, witnessed apneas, shortness of breath with exertion (improved). No excess mucus or change in color of mucus. No productive or non-productive. No hemoptysis. No wheezing.  No chest wall deformity GI:  No heartburn, indigestion GU: No dysuria, change in color of urine, urgency or  frequency.   Skin: No rash, lesions, ulcerations MSK:  No joint pain or swelling.   Neuro: No dizziness or lightheadedness.  Psych: +stable depression, anxiety. Mood stable. +sleep disturbance    Physical Exam:  BP 135/89 (BP Location: Left Arm, Patient Position: Sitting, Cuff Size: Large)   Pulse 85   Ht 5\' 8"  (1.727 m)   Wt (!) 318 lb (144.2 kg)   SpO2 96%   BMI 48.35 kg/m   GEN: Pleasant, interactive, well-kempt; morbidly obese; in no acute distress. HEENT:  Normocephalic and atraumatic. PERRLA. Sclera white. Nasal turbinates pink, moist and patent bilaterally. No rhinorrhea present. Oropharynx pink and moist, without exudate or edema. No lesions, ulcerations, or postnasal drip. Mallampati III/IV NECK:  Supple w/ fair ROM. No JVD present. Normal carotid impulses w/o bruits. Thyroid symmetrical with no goiter or nodules palpated. No lymphadenopathy.   CV: RRR, no m/r/g, no peripheral edema. Pulses intact, +2 bilaterally. No cyanosis, pallor or clubbing. PULMONARY:  Unlabored, regular breathing. Clear bilaterally A&P w/o wheezes/rales/rhonchi. No accessory muscle use.  GI: BS present and normoactive. Soft, non-tender to palpation. No organomegaly or masses detected.  MSK: No erythema, warmth or tenderness. Cap refil <2  @Patient  ID: Caleb Kent, male    DOB: 1995-11-11, 27 y.o.   MRN: [id number]  Chief Complaint  Patient presents with   Follow-up    ED 01/07/2023    Referring provider: Nelwyn Salisbury, MD  HPI: 27 year old male, active smoker referred for sleep consult June 2024.  Past medical history significant for mild asthma, obesity.  TEST/EVENTS:  01/07/2023 CXR: expiratory chest without acute process   09/21/2022: Ov with Reba Hulett NP for sleep consult, referred by Dr. Casimiro Needle.  He has had a lot of trouble with his sleep.  He is currently working third shift.  His fiance tells me that even before he started working 3rd shift, he would still sleep all day.  He has a lot of daytime grogginess.  Wakes in the morning feeling poorly rested.  She notices he snores at night and also wakes up gasping for air.  Tends to sleep sitting up.  Wakes frequently throughout the night.  His wife also tells me that she has noticed that he stops breathing when he sleeps.  Denies any morning headaches, drowsy driving, sleep parasomnia/paralysis.  No history of narcolepsy or symptoms of cataplexy. Goes to bed around 8 AM.  Falls asleep quickly.  Wakes 2-3 times.  Officially gets up around 5 PM.  He does operate [employer] lifts at his job- never fallen asleep on one.  Weight is relatively stable over the last 2 years.  Never had a previous sleep study.  Not on any oxygen therapy. He has mild intermittent asthma, controlled on as needed [place].  No history of cardiac disease, stroke or diabetes. He is an active smoker.  He also smokes marijuana.  Drinks alcohol twice a week.  Lives with his fiance.  No significant family history. [place] 20  01/24/2023: Today - follow up Patient presents today for follow up with his mother and father. He never had a sleep study after our last visit. He was recently hospitalized from 01/07/2023-01/12/2023 for acute hypoxic and hypercarbic respiratory failure, felt to be secondary to OHS and  possible OSA. Infectious workup unrevealing. He presented for possible asthma exacerbation; however, no significant bronchospasm on exam. VBG 7.31/57/48/29. Desaturated upon walk test, which resolved upon discharge, but he continued to have desaturations at night. He was started on BiPAP therapy while inpatient. Plan upon discharge was to obtain NIV. No changes to asthma regimen.   Today, he tells me that me that he is feeling much better. Not having any issues with his breathing. He's not using his albuterol much anymore. He doesn't think he was having issues with his asthma at the time. He has not had any asthma exacerbations since he was a child. Denies any wheezing, cough, chest tightness. He did get the NIV after discharge. He tries to wear it but he's having trouble with it making him feel very hot at night so he tends to take it off. He doesn't like the full face mask. His mother tries to remind him to put it on. He's does feel like he sleeps better with it. His parents notice his breathing is better with it and not noisy, unlike when he sleeps without it. He doesn't feel like he's been as tired during the day but still has some daytime sleepiness, usually when he doesn't wear the NIV. He denies any issues with drowsy driving. Denies morning headaches, sleep parasomnias/paralysis. No desaturations during the day.   No Known Allergies  Immunization History  Administered Date(s) Administered   DTP  06/30/1994, 08/24/1994, 11/02/1994, 07/22/1995, 08/04/1998   HIB (PRP-OMP) 06/30/1994, 08/24/1994, 11/02/1994, 07/22/1995   Hepatitis B 1995/02/13, 06/13/1994, 10/19/1994   Influenza Split 12/30/2010, 03/06/2012   Influenza Whole 02/12/2006, 12/23/2008, 12/27/2009   Influenza,inj,Quad PF,6+ Mos 02/26/2013, 01/20/2015, 04/24/2016, 04/12/2017   MMR 05/11/1995, 08/04/1998   Meningococcal Conjugate 05/07/2012   Meningococcal polysaccharide vaccine (MPSV4) 02/08/2010   OPV 06/01/1994, 08/24/1994,  10/09/1994, 08/04/1998   PFIZER(Purple Top)SARS-COV-2 Vaccination 06/30/2019, 07/21/2019   PPD Test 05/09/2012   Td 09/11/2006   Tdap 09/11/2006   Varicella 09/24/1995, 09/11/2006    Past Medical History:  Diagnosis Date   ADHD (attention deficit hyperactivity disorder)    "grew like"   Asthma    Shoulder separation, left, subsequent encounter    sees Dr. Eulah Pont    Strabismus    sees Dr. Maple Hudson   Tourette syndrome    resolved    Tobacco History: Social History   Tobacco Use  Smoking Status Every Day   Current packs/day: 0.20   Types: Cigarettes  Smokeless Tobacco Never   Ready to quit: Not Answered Counseling given: Not Answered   Outpatient Medications Prior to Visit  Medication Sig Dispense Refill   albuterol (PROAIR HFA) 108 (90 Base) MCG/ACT inhaler INHALE 2 PUFFS INTO THE LUNGS EVERY 4 HORUS AS NEEDED FOR WHEEZING/SHORTNESS OF BREATH 8.5 each 3   albuterol (PROVENTIL) (2.5 MG/3ML) 0.083% nebulizer solution USE 1 VIAL PER NEBULIZER EVERY 4 HOURS AS NEEDED FOR WHEEZING OR SHORTNESS OF BREATH 450 mL 3   phentermine 15 MG capsule Take 1 capsule (15 mg total) by mouth every morning. (Patient not taking: Reported on 01/24/2023) 30 capsule 5   No facility-administered medications prior to visit.     Review of Systems:   Constitutional: No weight loss or gain, night sweats, fevers, chills, or lassitude. +fatigue HEENT: No difficulty swallowing, tooth/dental problems, or sore throat. No sneezing, itching, ear ache, nasal congestion, or post nasal drip. +occasional headaches CV:  No chest pain, orthopnea, PND, swelling in lower extremities, anasarca, dizziness, palpitations, syncope Resp: +snoring, witnessed apneas, shortness of breath with exertion (improved). No excess mucus or change in color of mucus. No productive or non-productive. No hemoptysis. No wheezing.  No chest wall deformity GI:  No heartburn, indigestion GU: No dysuria, change in color of urine, urgency or  frequency.   Skin: No rash, lesions, ulcerations MSK:  No joint pain or swelling.   Neuro: No dizziness or lightheadedness.  Psych: +stable depression, anxiety. Mood stable. +sleep disturbance    Physical Exam:  BP 135/89 (BP Location: Left Arm, Patient Position: Sitting, Cuff Size: Large)   Pulse 85   Ht 5\' 8"  (1.727 m)   Wt (!) 318 lb (144.2 kg)   SpO2 96%   BMI 48.35 kg/m   GEN: Pleasant, interactive, well-kempt; morbidly obese; in no acute distress. HEENT:  Normocephalic and atraumatic. PERRLA. Sclera white. Nasal turbinates pink, moist and patent bilaterally. No rhinorrhea present. Oropharynx pink and moist, without exudate or edema. No lesions, ulcerations, or postnasal drip. Mallampati III/IV NECK:  Supple w/ fair ROM. No JVD present. Normal carotid impulses w/o bruits. Thyroid symmetrical with no goiter or nodules palpated. No lymphadenopathy.   CV: RRR, no m/r/g, no peripheral edema. Pulses intact, +2 bilaterally. No cyanosis, pallor or clubbing. PULMONARY:  Unlabored, regular breathing. Clear bilaterally A&P w/o wheezes/rales/rhonchi. No accessory muscle use.  GI: BS present and normoactive. Soft, non-tender to palpation. No organomegaly or masses detected.  MSK: No erythema, warmth or tenderness. Cap refil <2  06/30/1994, 08/24/1994, 11/02/1994, 07/22/1995, 08/04/1998   HIB (PRP-OMP) 06/30/1994, 08/24/1994, 11/02/1994, 07/22/1995   Hepatitis B 1995/02/13, 06/13/1994, 10/19/1994   Influenza Split 12/30/2010, 03/06/2012   Influenza Whole 02/12/2006, 12/23/2008, 12/27/2009   Influenza,inj,Quad PF,6+ Mos 02/26/2013, 01/20/2015, 04/24/2016, 04/12/2017   MMR 05/11/1995, 08/04/1998   Meningococcal Conjugate 05/07/2012   Meningococcal polysaccharide vaccine (MPSV4) 02/08/2010   OPV 06/01/1994, 08/24/1994,  10/09/1994, 08/04/1998   PFIZER(Purple Top)SARS-COV-2 Vaccination 06/30/2019, 07/21/2019   PPD Test 05/09/2012   Td 09/11/2006   Tdap 09/11/2006   Varicella 09/24/1995, 09/11/2006    Past Medical History:  Diagnosis Date   ADHD (attention deficit hyperactivity disorder)    "grew like"   Asthma    Shoulder separation, left, subsequent encounter    sees Dr. Eulah Pont    Strabismus    sees Dr. Maple Hudson   Tourette syndrome    resolved    Tobacco History: Social History   Tobacco Use  Smoking Status Every Day   Current packs/day: 0.20   Types: Cigarettes  Smokeless Tobacco Never   Ready to quit: Not Answered Counseling given: Not Answered   Outpatient Medications Prior to Visit  Medication Sig Dispense Refill   albuterol (PROAIR HFA) 108 (90 Base) MCG/ACT inhaler INHALE 2 PUFFS INTO THE LUNGS EVERY 4 HORUS AS NEEDED FOR WHEEZING/SHORTNESS OF BREATH 8.5 each 3   albuterol (PROVENTIL) (2.5 MG/3ML) 0.083% nebulizer solution USE 1 VIAL PER NEBULIZER EVERY 4 HOURS AS NEEDED FOR WHEEZING OR SHORTNESS OF BREATH 450 mL 3   phentermine 15 MG capsule Take 1 capsule (15 mg total) by mouth every morning. (Patient not taking: Reported on 01/24/2023) 30 capsule 5   No facility-administered medications prior to visit.     Review of Systems:   Constitutional: No weight loss or gain, night sweats, fevers, chills, or lassitude. +fatigue HEENT: No difficulty swallowing, tooth/dental problems, or sore throat. No sneezing, itching, ear ache, nasal congestion, or post nasal drip. +occasional headaches CV:  No chest pain, orthopnea, PND, swelling in lower extremities, anasarca, dizziness, palpitations, syncope Resp: +snoring, witnessed apneas, shortness of breath with exertion (improved). No excess mucus or change in color of mucus. No productive or non-productive. No hemoptysis. No wheezing.  No chest wall deformity GI:  No heartburn, indigestion GU: No dysuria, change in color of urine, urgency or  frequency.   Skin: No rash, lesions, ulcerations MSK:  No joint pain or swelling.   Neuro: No dizziness or lightheadedness.  Psych: +stable depression, anxiety. Mood stable. +sleep disturbance    Physical Exam:  BP 135/89 (BP Location: Left Arm, Patient Position: Sitting, Cuff Size: Large)   Pulse 85   Ht 5\' 8"  (1.727 m)   Wt (!) 318 lb (144.2 kg)   SpO2 96%   BMI 48.35 kg/m   GEN: Pleasant, interactive, well-kempt; morbidly obese; in no acute distress. HEENT:  Normocephalic and atraumatic. PERRLA. Sclera white. Nasal turbinates pink, moist and patent bilaterally. No rhinorrhea present. Oropharynx pink and moist, without exudate or edema. No lesions, ulcerations, or postnasal drip. Mallampati III/IV NECK:  Supple w/ fair ROM. No JVD present. Normal carotid impulses w/o bruits. Thyroid symmetrical with no goiter or nodules palpated. No lymphadenopathy.   CV: RRR, no m/r/g, no peripheral edema. Pulses intact, +2 bilaterally. No cyanosis, pallor or clubbing. PULMONARY:  Unlabored, regular breathing. Clear bilaterally A&P w/o wheezes/rales/rhonchi. No accessory muscle use.  GI: BS present and normoactive. Soft, non-tender to palpation. No organomegaly or masses detected.  MSK: No erythema, warmth or tenderness. Cap refil <2

## 2023-03-15 ENCOUNTER — Ambulatory Visit (HOSPITAL_BASED_OUTPATIENT_CLINIC_OR_DEPARTMENT_OTHER): Payer: 59 | Attending: Nurse Practitioner | Admitting: Internal Medicine

## 2023-03-21 ENCOUNTER — Encounter: Payer: 59 | Attending: Student | Admitting: Dietician

## 2023-03-23 ENCOUNTER — Telehealth: Payer: Self-pay | Admitting: Nurse Practitioner

## 2023-03-23 ENCOUNTER — Ambulatory Visit: Payer: 59 | Admitting: Nurse Practitioner

## 2023-03-23 NOTE — Telephone Encounter (Signed)
Please review non invasive ventilator downloads with patient at today's appointment.

## 2023-04-20 ENCOUNTER — Telehealth: Payer: Self-pay

## 2023-04-20 DIAGNOSIS — G4733 Obstructive sleep apnea (adult) (pediatric): Secondary | ICD-10-CM

## 2023-04-20 NOTE — Telephone Encounter (Signed)
Copied from CRM [id number]. Topic: Clinical - Medication Question >> Apr 20, 2023 10:24 AM Corin V wrote: Reason for CRM: Patient's mother called in stating that the patient's nebulizer will no longer turn on. Please call Darl Pikes back to let her know possible solutions. She is a crossing guard for a school and will be unavailable from 1:30-3:00.

## 2023-04-25 NOTE — Addendum Note (Signed)
Addended by: Carola Rhine on: 04/25/2023 09:41 AM   Modules accepted: Orders

## 2023-04-25 NOTE — Telephone Encounter (Signed)
New order for a nebulizer machine sent to [facility], Pt mother notified

## 2023-04-27 ENCOUNTER — Ambulatory Visit (HOSPITAL_BASED_OUTPATIENT_CLINIC_OR_DEPARTMENT_OTHER): Payer: 59 | Attending: Nurse Practitioner | Admitting: Pulmonary Disease

## 2023-04-27 VITALS — Ht 69.0 in | Wt 305.0 lb

## 2023-04-27 DIAGNOSIS — R0683 Snoring: Secondary | ICD-10-CM | POA: Diagnosis not present

## 2023-04-27 DIAGNOSIS — E662 Morbid (severe) obesity with alveolar hypoventilation: Secondary | ICD-10-CM | POA: Insufficient documentation

## 2023-04-27 DIAGNOSIS — R400 Somnolence: Secondary | ICD-10-CM | POA: Insufficient documentation

## 2023-04-27 DIAGNOSIS — G4733 Obstructive sleep apnea (adult) (pediatric): Secondary | ICD-10-CM

## 2023-05-02 DIAGNOSIS — G4733 Obstructive sleep apnea (adult) (pediatric): Secondary | ICD-10-CM

## 2023-05-02 NOTE — Procedures (Signed)
Patient Name: Caleb Kent, Caleb Kent Date: 04/27/2023 Gender: Male D.O.B: 1995/04/24 Age (years): 28 Referring Provider: Noemi Chapel NP Height (inches): 69 Interpreting Physician: Cyril Mourning MD, ABSM Weight (lbs): 305 RPSGT: Cherylann Parr BMI: 45 MRN: [id number] Neck Size: 18.00 <br> <br> CLINICAL INFORMATION The patient is referred for a split night study with BPAP. MEDICATIONS Medications self-administered by patient taken the night of the study : N/A  SLEEP STUDY TECHNIQUE As per the AASM Manual for the Scoring of Sleep and Associated Events v2.3 (April 2016) with a hypopnea requiring 4% desaturations.  The channels recorded and monitored were frontal, central and occipital EEG, electrooculogram (EOG), submentalis EMG (chin), nasal and oral airflow, thoracic and abdominal wall motion, anterior tibialis EMG, snore microphone, electrocardiogram, and pulse oximetry. Bi-level positive airway pressure (BiPAP) was initiated when the patient met split night criteria and was titrated according to treat sleep-disordered breathing.  RESPIRATORY PARAMETERS Diagnostic  Total AHI (/hr): 136.5 RDI (/hr): 136.5 OA Index (/hr): 124.2 CA Index (/hr): 0.0 REM AHI (/hr): 88.4 NREM AHI (/hr): 140.9 Supine AHI (/hr): 138.9 Non-supine AHI (/hr): 135.41 Min O2 Sat (%): 51.00 Mean O2 (%): 78.46 Time below 88% (min): 95.4   Titration  Optimal IPAP Pressure (cm): 20 Optimal EPAP Pressure (cm): 16 AHI at Optimal Pressure (/hr): 0 Min O2 at Optimal Pressure (%): 83.0 Sleep % at Optimal (%): 73 Supine % at Optimal (%): 0     SLEEP ARCHITECTURE The study was initiated at 10:24:30 PM and terminated at 4:29:08 AM. The total recorded time was 364.6 minutes. EEG confirmed total sleep time was 297.5 minutes yielding a sleep efficiency of 81.6%. Sleep onset after lights out was 25.2 minutes with a REM latency of 102.0 minutes. The patient spent 1.51% of the night in stage N1 sleep, 86.89% in stage N2  sleep, 7.06% in stage N3 and 4.5% in REM. Wake after sleep onset (WASO) was 41.9 minutes. The Arousal Index was 3.0/hour.  LEG MOVEMENT DATA The total Periodic Limb Movements of Sleep (PLMS) were 0. The PLMS index was 0.00 .  CARDIAC DATA The 2 lead EKG demonstrated sinus rhythm. The mean heart rate was 87.75 beats per minute. Other EKG findings include: None.   IMPRESSIONS - Severe obstructive sleep apnea occurred during the diagnostic portion of the study (AHI = 136.5 /hour). An optimal PAP pressure was selected for this patient ( 20 /16 cm of water) but minimal time observed at this level - Severe oxygen desaturation was noted during the diagnostic portion of the study (Min O2 = 51.00%). He desaturated even at higher levels of bipap - No snoring was audible during this study. - No cardiac abnormalities were noted during this study. - Clinically significant periodic limb movements of sleep did not occur during the study.   DIAGNOSIS - Obstructive Sleep Apnea (G47.33)   RECOMMENDATIONS - Trial of BiPAP therapy on 20/16 cm H2O with a Large size Fisher&Paykel Nasal Eson mask and heated humidification.Alternatively,autobilevel with PS of 4, IPAP max 24, EPAP min 12 can be used - He will qualify for oxygen. Significant desaturations were noted at final biPAP level but oxygen was not added. Please follow up with nocturnal oximetry once he gets comfortable with biPAP to reassess - Avoid alcohol, sedatives and other CNS depressants that may worsen sleep apnea and disrupt normal sleep architecture. - Sleep hygiene should be reviewed to assess factors that may improve sleep quality. - Weight management and regular exercise should be initiated or continued. - Return to [facility] for re-evaluation.  Cyril Mourning MD Board Certified in Sleep medicine

## 2023-05-07 ENCOUNTER — Other Ambulatory Visit: Payer: Self-pay | Admitting: Nurse Practitioner

## 2023-05-07 DIAGNOSIS — G4733 Obstructive sleep apnea (adult) (pediatric): Secondary | ICD-10-CM

## 2023-05-07 NOTE — Progress Notes (Signed)
I called and spoke with the pt and notified of results/recs per [facility]. He verbalized understanding. Order for BIPAP sent.

## 2023-05-14 ENCOUNTER — Ambulatory Visit: Payer: 59 | Admitting: Nurse Practitioner

## 2023-05-18 ENCOUNTER — Telehealth: Payer: Self-pay | Admitting: Nurse Practitioner

## 2023-05-18 NOTE — Telephone Encounter (Signed)
Darl Pikes mother states [facility] does not take [facility]. Patient needs BIPAP machine. Darl Pikes phone number is [phone].Patient does not have a machine. Needs one for tonight.

## 2023-05-21 NOTE — Telephone Encounter (Signed)
PT's mother is calling After hours message system  for Cpap

## 2023-05-22 NOTE — Telephone Encounter (Signed)
I have now resent the order to [facility] per Mother's request

## 2023-05-22 NOTE — Telephone Encounter (Signed)
It looks like [place] sent to [facility]

## 2023-05-22 NOTE — Telephone Encounter (Signed)
Cpap machine order needs to be sent to [facility] since they take his insurance Fax number [phone]

## 2023-05-23 NOTE — Telephone Encounter (Signed)
Nothing further needed at this time. 

## 2023-06-19 ENCOUNTER — Ambulatory Visit: Payer: 59 | Admitting: Nurse Practitioner

## 2023-06-20 ENCOUNTER — Encounter: Payer: Self-pay | Admitting: Nurse Practitioner

## 2023-08-20 ENCOUNTER — Ambulatory Visit: Admitting: Nurse Practitioner

## 2023-08-20 ENCOUNTER — Encounter: Payer: Self-pay | Admitting: Nurse Practitioner

## 2023-08-20 VITALS — BP 135/85 | HR 95 | Ht 67.0 in | Wt 295.2 lb

## 2023-08-20 DIAGNOSIS — Z6841 Body Mass Index (BMI) 40.0 and over, adult: Secondary | ICD-10-CM | POA: Diagnosis not present

## 2023-08-20 DIAGNOSIS — G4733 Obstructive sleep apnea (adult) (pediatric): Secondary | ICD-10-CM | POA: Diagnosis not present

## 2023-08-20 NOTE — Assessment & Plan Note (Signed)
 BMI 46. Encouraged continued healthy weight loss measures. He would be a candidate for GLP 1 therapy. Will need to investigate insurance. Can reassess at follow up.

## 2023-08-20 NOTE — Assessment & Plan Note (Signed)
 Very severe OHS/OSA. Suboptimal compliance with BiPAP. Difficulties tolerating due to pressures which leads to some of his compliance issues. Will adjust him to auto IPAP max 20 and EPAP min 14 with PS 4. Turned ramp off. Plan to set him up with urgent BiPAP titration as he spent minimal time on optimal pressure and still had severe oxygen desaturations during his split night study. He may require supplemental O2 as well. We again reviewed risks of untreated severe OSA and hypoxia. Verbalized understanding. He will resume therapy and notify of any issues. Healthy weight loss encouraged - advised him to see about current coverage for Zepbound. Also encouraged him to see if he qualifies for Medicaid as he's unemployed, which would cover Zepbound in setting of severe OSA. He and his parents will work on this. Safe driving practices reviewed. Educated on proper use/care.   Patient Instructions  Restart BiPAP every night, minimum of 4-6 hours a night.  Change equipment as directed. Wash your tubing with warm soap and water daily, hang to dry. Wash humidifier portion weekly. Use bottled, distilled water and change daily Be aware of reduced alertness and do not drive or operate heavy machinery if experiencing this or drowsiness.  Exercise encouraged, as tolerated. Healthy weight management discussed.  Avoid or decrease alcohol consumption and medications that make you more sleepy, if possible. Notify if persistent daytime sleepiness occurs even with consistent use of PAP therapy.  We discussed how untreated sleep apnea puts an individual at risk for cardiac arrhthymias, pulm HTN, DM, stroke and increases their risk for daytime accidents.   Adjusted BiPAP settings to auto IPAP max 20, EPAP min 14, PS 4 and turned off your ramp  Orders placed for BiPAP in lab titration study - they should contact you in a week to schedule this  We talked about the importance of using therapy and how untreated severe sleep  apnea is impacting your ability to work  I would see if you can get Medicaid since you are currently unemployed You would quality for Zepbound injections through [facility] for weight management in moderate to severe sleep apnea  Follow up in 6 weeks with Dr. Gaynell Keeler (new pt) o Donnita Gales, If symptoms do not improve or worsen, please contact office for sooner follow up or seek emergency care.

## 2023-08-20 NOTE — Patient Instructions (Addendum)
 Restart BiPAP every night, minimum of 4-6 hours a night.  Change equipment as directed. Wash your tubing with warm soap and water daily, hang to dry. Wash humidifier portion weekly. Use bottled, distilled water and change daily Be aware of reduced alertness and do not drive or operate heavy machinery if experiencing this or drowsiness.  Exercise encouraged, as tolerated. Healthy weight management discussed.  Avoid or decrease alcohol consumption and medications that make you more sleepy, if possible. Notify if persistent daytime sleepiness occurs even with consistent use of PAP therapy.  We discussed how untreated sleep apnea puts an individual at risk for cardiac arrhthymias, pulm HTN, DM, stroke and increases their risk for daytime accidents.   Adjusted BiPAP settings to auto IPAP max 20, EPAP min 14, PS 4 and turned off your ramp  Orders placed for BiPAP in lab titration study - they should contact you in a week to schedule this  We talked about the importance of using therapy and how untreated severe sleep apnea is impacting your ability to work  I would see if you can get Medicaid since you are currently unemployed You would quality for Zepbound injections through [facility] for weight management in moderate to severe sleep apnea  Follow up in 6 weeks with Dr. Gaynell Keeler (new pt) o Donnita Gales, If symptoms do not improve or worsen, please contact office for sooner follow up or seek emergency care.

## 2023-08-20 NOTE — Progress Notes (Signed)
 @Patient  ID: Caleb Kent, male    DOB: November 01, 1995, 28 y.o.   MRN: [id number]  Chief Complaint  Patient presents with   Follow-up    Follow-up on OSA. Patient states his CPAP machine feels suffocating     Referring provider: Donley Furth, MD  HPI: 28 year old male, active smoker followed for severe OSA/OHS.  Past medical history significant for mild asthma, obesity.  TEST/EVENTS:  01/07/2023 CXR: expiratory chest without acute process  04/27/2023 Split Night: AHI 136/h, SpO2 low 51% >> BiPAP 20/16 but minimal time observed at this level; still had ongoing desaturations   09/21/2022: Ov with Zonya Gudger NP for sleep consult, referred by Dr. Bambi Lever.  He has had a lot of trouble with his sleep.  He is currently working third shift.  His fiance tells me that even before he started working 3rd shift, he would still sleep all day.  He has a lot of daytime grogginess.  Wakes in the morning feeling poorly rested.  She notices he snores at night and also wakes up gasping for air.  Tends to sleep sitting up.  Wakes frequently throughout the night.  His wife also tells me that she has noticed that he stops breathing when he sleeps.  Denies any morning headaches, drowsy driving, sleep parasomnia/paralysis.  No history of narcolepsy or symptoms of cataplexy. Goes to bed around 8 AM.  Falls asleep quickly.  Wakes 2-3 times.  Officially gets up around 5 PM.  He does operate [employer] lifts at his job- never fallen asleep on one.  Weight is relatively stable over the last 2 years.  Never had a previous sleep study.  Not on any oxygen therapy. He has mild intermittent asthma, controlled on as needed [place].  No history of cardiac disease, stroke or diabetes. He is an active smoker.  He also smokes marijuana.  Drinks alcohol twice a week.  Lives with his fiance.  No significant family history. [place] 20  01/24/2023: OV with Shamir Sedlar NP for follow up with his mother and father. He never had a sleep study after our  last visit. He was recently hospitalized from 01/07/2023-01/12/2023 for acute hypoxic and hypercarbic respiratory failure, felt to be secondary to OHS and possible OSA. Infectious workup unrevealing. He presented for possible asthma exacerbation; however, no significant bronchospasm on exam. VBG 7.31/57/48/29. Desaturated upon walk test, which resolved upon discharge, but he continued to have desaturations at night. He was started on BiPAP therapy while inpatient. Plan upon discharge was to obtain NIV. No changes to asthma regimen.  Today, he tells me that me that he is feeling much better. Not having any issues with his breathing. He's not using his albuterol  much anymore. He doesn't think he was having issues with his asthma at the time. He has not had any asthma exacerbations since he was a child. Denies any wheezing, cough, chest tightness. He did get the NIV after discharge. He tries to wear it but he's having trouble with it making him feel very hot at night so he tends to take it off. He doesn't like the full face mask. His mother tries to remind him to put it on. He's does feel like he sleeps better with it. His parents notice his breathing is better with it and not noisy, unlike when he sleeps without it. He doesn't feel like he's been as tired during the day but still has some daytime sleepiness, usually when he doesn't wear the NIV. He denies any issues with drowsy  driving. Denies morning headaches, sleep parasomnias/paralysis. No desaturations during the day.    08/20/2023: Today - follow up Patient presents today for follow up with his dad. He was lost to follow up the past few months. He had been discharged last year from the hospital on NIV. He then underwent in lab split night study which revealed very severe OSA/OHS, controlled with BiPAP so he was transitioned to BiPAP therapy. He also had ongoing nocturnal hypoxia but there was not enough time evaluated on BiPAP and no supplemental oxygen was  added.  He has not been using his BiPAP consistently. He continues to have a lot of issus with his sleep, waking frequently and feeling like his choking/gasping. His father also notes episodes where he stops breathing and so he wakes him up because of it. He's very tired all of the time. 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 Denies morning headaches, sleep parasomnias/paralysis, drowsy driving in a car around town. No accidents related to drowsy driving or falling asleep while driving.  Breathing is otherwise stable. No asthma exacerbations. Rare use of SABA. He is trying to work on weight loss. Down about 10 lb since his sleep study.   He does feel like he has trouble breathing on the BiPAP and sometimes it's blowing too much air in to where he feels like he can't take his own breath. He also doesn't like the ramp speed/rate. He feels like he can't breathe when he puts it on. Wearing a full face mask.   05/12/2023-08/09/2023 BiPAP auto 24/12, PS 4 29/90 days; 2% >4 hr; average use 1 hr 29 min Pressure 95th IPAP 16.8, EPAP 13 Leaks 61.8 AHI 4.1  No Known Allergies  Immunization History  Administered Date(s) Administered   DTP 06/30/1994, 08/24/1994, 11/02/1994, 07/22/1995, 08/04/1998   HIB (PRP-OMP) 06/30/1994, 08/24/1994, 11/02/1994, 07/22/1995   Hepatitis B 04-02-95, 06/13/1994, 10/19/1994   Influenza Split 12/30/2010, 03/06/2012   Influenza Whole 02/12/2006, 12/23/2008, 12/27/2009   Influenza,inj,Quad PF,6+ Mos 02/26/2013, 01/20/2015, 04/24/2016, 04/12/2017   MMR 05/11/1995, 08/04/1998   Meningococcal Conjugate 05/07/2012   Meningococcal polysaccharide vaccine (MPSV4) 02/08/2010   OPV 06/01/1994, 08/24/1994, 10/09/1994, 08/04/1998   PFIZER(Purple Top)SARS-COV-2 Vaccination 06/30/2019, 07/21/2019   PPD Test 05/09/2012   Td 09/11/2006   Tdap 09/11/2006   Varicella 09/24/1995, 09/11/2006    Past Medical History:  Diagnosis Date    ADHD (attention deficit hyperactivity disorder)    "grew like"   Asthma    Shoulder separation, left, subsequent encounter    sees Dr. Abigail Abler    Strabismus    sees Dr. Linder Revere   Tourette syndrome    resolved    Tobacco History: Social History   Tobacco Use  Smoking Status Every Day   Current packs/day: 0.20   Types: Cigarettes  Smokeless Tobacco Never  Tobacco Comments   A pack every 3 days. 08/20/2023   Ready to quit: Not Answered Counseling given: Not Answered Tobacco comments: A pack every 3 days. 08/20/2023   Outpatient Medications Prior to Visit  Medication Sig Dispense Refill   albuterol  (PROAIR  HFA) 108 (90 Base) MCG/ACT inhaler INHALE 2 PUFFS INTO THE LUNGS EVERY 4 HORUS AS NEEDED FOR WHEEZING/SHORTNESS OF BREATH 8.5 each 3   albuterol  (PROVENTIL ) (2.5 MG/3ML) 0.083% nebulizer solution USE 1 VIAL PER NEBULIZER EVERY 4 HOURS AS NEEDED FOR WHEEZING OR SHORTNESS OF BREATH 450 mL 3   phentermine  15 MG capsule Take 1 capsule (15  mg total) by mouth every morning. (Patient not taking: Reported on 01/24/2023) 30 capsule 5   No facility-administered medications prior to visit.     Review of Systems:   Constitutional: No weight loss or gain, night sweats, fevers, chills, or lassitude. +fatigue HEENT: No difficulty swallowing, tooth/dental problems, or sore throat. No sneezing, itching, ear ache, nasal congestion, or post nasal drip. +occasional headaches CV:  No chest pain, orthopnea, PND, swelling in lower extremities, anasarca, dizziness, palpitations, syncope Resp: +snoring, witnessed apneas, shortness of breath with exertion (baseline). No excess mucus or change in color of mucus. No productive or non-productive. No hemoptysis. No wheezing.  No chest wall deformity GI:  No heartburn, indigestion GU: No dysuria, change in color of urine, urgency or frequency.   Skin: No rash, lesions, ulcerations MSK:  No joint pain or swelling.   Neuro: No dizziness or lightheadedness.   Psych: +stable depression, anxiety. Mood stable. +sleep disturbance    Physical Exam:  BP 135/85 (BP Location: Right Arm, Patient Position: Sitting, Cuff Size: Large)   Pulse 95   Ht 5\' 7"  (1.702 m)   Wt 295 lb 3.2 oz (133.9 kg)   SpO2 92%   BMI 46.23 kg/m   GEN: Pleasant, interactive, well-kempt; morbidly obese; in no acute distress. HEENT:  Normocephalic and atraumatic. PERRLA. Sclera white. Nasal turbinates pink, moist and patent bilaterally. No rhinorrhea present. Oropharynx pink and moist, without exudate or edema. No lesions, ulcerations, or postnasal drip. Mallampati III/IV NECK:  Supple w/ fair ROM. No JVD present. Normal carotid impulses w/o bruits. Thyroid symmetrical with no goiter or nodules palpated. No lymphadenopathy.   CV: RRR, no m/r/g, no peripheral edema. Pulses intact, +2 bilaterally. No cyanosis, pallor or clubbing. PULMONARY:  Unlabored, regular breathing. Clear bilaterally A&P w/o wheezes/rales/rhonchi. No accessory muscle use.  GI: BS present and normoactive. Soft, non-tender to palpation. No organomegaly or masses detected.  MSK: No erythema, warmth or tenderness. Cap refil <2 sec all extrem. No deformities or joint swelling noted.  Neuro: A/Ox3. No focal deficits noted.   Skin: Warm, no lesions or rashe Psych: Normal affect and behavior. Judgement and thought content appropriate.     Lab Results:  CBC    Component Value Date/Time   WBC 16.0 (H) 01/09/2023 0212   RBC 5.70 01/09/2023 0212   HGB 15.4 01/09/2023 0212   HCT 49.1 01/09/2023 0212   PLT 153 01/09/2023 0212   MCV 86.1 01/09/2023 0212   MCH 27.0 01/09/2023 0212   MCHC 31.4 01/09/2023 0212   RDW 13.7 01/09/2023 0212   LYMPHSABS 2.5 01/07/2023 0454   MONOABS 1.0 01/07/2023 0454   EOSABS 0.6 (H) 01/07/2023 0454   BASOSABS 0.1 01/07/2023 0454    BMET    Component Value Date/Time   NA 138 01/09/2023 0212   K 4.1 01/09/2023 0212   CL 103 01/09/2023 0212   CO2 28 01/09/2023 0212    GLUCOSE 126 (H) 01/09/2023 0212   BUN 16 01/09/2023 0212   CREATININE 1.08 01/09/2023 0212   CALCIUM 8.7 (L) 01/09/2023 0212   GFRNONAA >60 01/09/2023 0212    BNP No results found for: "BNP"   Imaging:  No results found.  Administration History     None           No data to display          No results found for: "NITRICOXIDE"      Assessment & Plan:   OSA (obstructive sleep apnea) Very severe OHS/OSA. Suboptimal  compliance with BiPAP. Difficulties tolerating due to pressures which leads to some of his compliance issues. Will adjust him to auto IPAP max 20 and EPAP min 14 with PS 4. Turned ramp off. Plan to set him up with urgent BiPAP titration as he spent minimal time on optimal pressure and still had severe oxygen desaturations during his split night study. He may require supplemental O2 as well. We again reviewed risks of untreated severe OSA and hypoxia. Verbalized understanding. He will resume therapy and notify of any issues. Healthy weight loss encouraged - advised him to see about current coverage for Zepbound. Also encouraged him to see if he qualifies for Medicaid as he's unemployed, which would cover Zepbound in setting of severe OSA. He and his parents will work on this. Safe driving practices reviewed. Educated on proper use/care.   Patient Instructions  Restart BiPAP every night, minimum of 4-6 hours a night.  Change equipment as directed. Wash your tubing with warm soap and water daily, hang to dry. Wash humidifier portion weekly. Use bottled, distilled water and change daily Be aware of reduced alertness and do not drive or operate heavy machinery if experiencing this or drowsiness.  Exercise encouraged, as tolerated. Healthy weight management discussed.  Avoid or decrease alcohol consumption and medications that make you more sleepy, if possible. Notify if persistent daytime sleepiness occurs even with consistent use of PAP therapy.  We discussed how  untreated sleep apnea puts an individual at risk for cardiac arrhthymias, pulm HTN, DM, stroke and increases their risk for daytime accidents.   Adjusted BiPAP settings to auto IPAP max 20, EPAP min 14, PS 4 and turned off your ramp  Orders placed for BiPAP in lab titration study - they should contact you in a week to schedule this  We talked about the importance of using therapy and how untreated severe sleep apnea is impacting your ability to work  I would see if you can get Medicaid since you are currently unemployed You would quality for Zepbound injections through [facility] for weight management in moderate to severe sleep apnea  Follow up in 6 weeks with Dr. Gaynell Keeler (new pt) o Donnita Gales, If symptoms do not improve or worsen, please contact office for sooner follow up or seek emergency care.    Morbid obesity (HCC) BMI 46. Encouraged continued healthy weight loss measures. He would be a candidate for GLP 1 therapy. Will need to investigate insurance. Can reassess at follow up.      I spent 35 minutes of dedicated to the care of this patient on the date of this encounter to include pre-visit review of records, face-to-face time with the patient discussing conditions above, post visit ordering of testing, clinical documentation with the electronic health record, making appropriate referrals as documented, and communicating necessary findings to members of the patients care team.  Roetta Clarke, NP 08/20/2023  Pt aware and understands NP's role.

## 2023-08-29 ENCOUNTER — Ambulatory Visit (INDEPENDENT_AMBULATORY_CARE_PROVIDER_SITE_OTHER): Admitting: Family Medicine

## 2023-08-29 ENCOUNTER — Encounter: Payer: Self-pay | Admitting: Family Medicine

## 2023-08-29 VITALS — BP 132/88 | HR 94 | Temp 97.8°F | Ht 68.0 in | Wt 299.0 lb

## 2023-08-29 DIAGNOSIS — J4520 Mild intermittent asthma, uncomplicated: Secondary | ICD-10-CM

## 2023-08-29 DIAGNOSIS — Z0000 Encounter for general adult medical examination without abnormal findings: Secondary | ICD-10-CM | POA: Diagnosis not present

## 2023-08-29 LAB — CBC WITH DIFFERENTIAL/PLATELET
Basophils Absolute: 0.1 10*3/uL (ref 0.0–0.1)
Basophils Relative: 0.7 % (ref 0.0–3.0)
Eosinophils Absolute: 0.6 10*3/uL (ref 0.0–0.7)
Eosinophils Relative: 6.1 % — ABNORMAL HIGH (ref 0.0–5.0)
HCT: 52.6 % — ABNORMAL HIGH (ref 39.0–52.0)
Hemoglobin: 16.8 g/dL (ref 13.0–17.0)
Lymphocytes Relative: 23.9 % (ref 12.0–46.0)
Lymphs Abs: 2.4 10*3/uL (ref 0.7–4.0)
MCHC: 31.9 g/dL (ref 30.0–36.0)
MCV: 82.2 fl (ref 78.0–100.0)
Monocytes Absolute: 0.9 10*3/uL (ref 0.1–1.0)
Monocytes Relative: 9 % (ref 3.0–12.0)
Neutro Abs: 6 10*3/uL (ref 1.4–7.7)
Neutrophils Relative %: 60.3 % (ref 43.0–77.0)
Platelets: 131 10*3/uL — ABNORMAL LOW (ref 150.0–400.0)
RBC: 6.4 Mil/uL — ABNORMAL HIGH (ref 4.22–5.81)
RDW: 15.6 % — ABNORMAL HIGH (ref 11.5–15.5)
WBC: 9.9 10*3/uL (ref 4.0–10.5)

## 2023-08-29 LAB — BASIC METABOLIC PANEL WITH GFR
BUN: 13 mg/dL (ref 6–23)
CO2: 28 meq/L (ref 19–32)
Calcium: 8.9 mg/dL (ref 8.4–10.5)
Chloride: 103 meq/L (ref 96–112)
Creatinine, Ser: 1.08 mg/dL (ref 0.40–1.50)
GFR: 92.83 mL/min (ref >60.00)
Glucose, Bld: 91 mg/dL (ref 70–99)
Potassium: 4.4 meq/L (ref 3.5–5.1)
Sodium: 137 meq/L (ref 135–145)

## 2023-08-29 LAB — TSH: TSH: 4.61 u[IU]/mL (ref 0.35–5.50)

## 2023-08-29 LAB — HEPATIC FUNCTION PANEL
ALT: 22 U/L (ref 0–53)
AST: 20 U/L (ref 0–37)
Albumin: 4.1 g/dL (ref 3.5–5.2)
Alkaline Phosphatase: 54 U/L (ref 39–117)
Bilirubin, Direct: 0 mg/dL (ref 0.0–0.3)
Total Bilirubin: 0.2 mg/dL (ref 0.2–1.2)
Total Protein: 7 g/dL (ref 6.0–8.3)

## 2023-08-29 LAB — HEMOGLOBIN A1C: Hgb A1c MFr Bld: 5.9 % (ref 4.6–6.5)

## 2023-08-29 LAB — LIPID PANEL
Cholesterol: 204 mg/dL — ABNORMAL HIGH (ref 0–200)
HDL: 45.1 mg/dL (ref >39.00)
LDL Cholesterol: 119 mg/dL — ABNORMAL HIGH (ref 0–99)
NonHDL: 159.34
Total CHOL/HDL Ratio: 5
Triglycerides: 204 mg/dL — ABNORMAL HIGH (ref 0.0–149.0)
VLDL: 40.8 mg/dL — ABNORMAL HIGH (ref 0.0–40.0)

## 2023-08-29 MED ORDER — ALBUTEROL SULFATE HFA 108 (90 BASE) MCG/ACT IN AERS: INHALATION_SPRAY | RESPIRATORY_TRACT | 11 refills | Status: AC

## 2023-08-29 MED ORDER — ALBUTEROL SULFATE (2.5 MG/3ML) 0.083% IN NEBU: INHALATION_SOLUTION | RESPIRATORY_TRACT | 11 refills | Status: DC

## 2023-08-29 MED ORDER — OZEMPIC (0.25 OR 0.5 MG/DOSE) 2 MG/3ML ~~LOC~~ SOPN: 0.2500 mg | PEN_INJECTOR | SUBCUTANEOUS | 2 refills | Status: DC

## 2023-08-29 NOTE — Progress Notes (Addendum)
 Subjective:    Patient ID: Caleb Kent, male    DOB: [birth year], 28 y.o.   MRN: [id number]  HPI Here for a well exam. His main complaint today is constant fatigue, and he is seeing Pulmonology for severe obstructive sleep apnea. He has been seeing Comer Rouleau NP for this, and he has been changed from CPAP to BiPAP. He still does not think this is working well however. He still wakes up choking a feeling SOB frequently. He is scheduled to see Dr. Neda on 11-07-23. He has been slowly losing weight, and he is down 19 lbs from last October. He is interested in trying one of the GLP-1 medications. He has tried taking Phentermine  with no good results.    Review of Systems  Constitutional:  Positive for fatigue.  HENT: Negative.    Eyes: Negative.   Respiratory: Negative.    Cardiovascular: Negative.   Gastrointestinal: Negative.   Genitourinary: Negative.   Musculoskeletal: Negative.   Skin: Negative.   Neurological: Negative.   Psychiatric/Behavioral: Negative.         Objective:   Physical Exam Constitutional:      General: He is not in acute distress.    Appearance: He is well-developed. He is obese. He is not diaphoretic.  HENT:     Head: Normocephalic and atraumatic.     Right Ear: External ear normal.     Left Ear: External ear normal.     Nose: Nose normal.     Mouth/Throat:     Pharynx: No oropharyngeal exudate.  Eyes:     General: No scleral icterus.       Right eye: No discharge.        Left eye: No discharge.     Conjunctiva/sclera: Conjunctivae normal.     Pupils: Pupils are equal, round, and reactive to light.  Neck:     Thyroid : No thyromegaly.     Vascular: No JVD.     Trachea: No tracheal deviation.  Cardiovascular:     Rate and Rhythm: Normal rate and regular rhythm.     Pulses: Normal pulses.     Heart sounds: Normal heart sounds. No murmur heard.    No friction rub. No gallop.  Pulmonary:     Effort: Pulmonary effort is normal. No  respiratory distress.     Breath sounds: Normal breath sounds. No wheezing or rales.  Chest:     Chest wall: No tenderness.  Abdominal:     General: Bowel sounds are normal. There is no distension.     Palpations: Abdomen is soft. There is no mass.     Tenderness: There is no abdominal tenderness. There is no guarding or rebound.  Genitourinary:    Penis: Normal. No tenderness.      Testes: Normal.  Musculoskeletal:        General: No tenderness. Normal range of motion.     Cervical back: Neck supple.  Lymphadenopathy:     Cervical: No cervical adenopathy.  Skin:    General: Skin is warm and dry.     Coloration: Skin is not pale.     Findings: No erythema or rash.  Neurological:     General: No focal deficit present.     Mental Status: He is alert and oriented to person, place, and time.     Cranial Nerves: No cranial nerve deficit.     Motor: No abnormal muscle tone.     Coordination: Coordination normal.  Deep Tendon Reflexes: Reflexes are normal and symmetric. Reflexes normal.  Psychiatric:        Mood and Affect: Mood normal.        Behavior: Behavior normal.        Thought Content: Thought content normal.        Judgment: Judgment normal.           Assessment & Plan:  Well exam. We discussed diet and exercise. He is currently consuming an average of 1800 calories a day, and he exercises in the gym for an hour 3 days a week. Get fasting labs. He will follow up with Pulmonology for the OSA. We will start him on Ozempic  0.25 mg weekly to help with weight loss.  Garnette Olmsted, MD

## 2023-08-30 ENCOUNTER — Ambulatory Visit: Payer: Self-pay | Admitting: Family Medicine

## 2023-09-10 ENCOUNTER — Telehealth: Payer: Self-pay | Admitting: Pharmacist

## 2023-09-10 NOTE — Telephone Encounter (Signed)
 Ozempic is approved exclusively as an adjunct to diet and exercise to improve glycemic control in adults with type 2 diabetes mellitus. A review of patient's medical chart reveals no documented diagnosis of type 2 diabetes or an A1C indicative of diabetes. Therefore, they do not currently meet the criteria for prior authorization of this medication. If clinically appropriate, alternative options such as Saxenda, Zepbound, or Frederik Jansky may be considered for this patient.   Thank you,  Dene Fines, PharmD Clinical Pharmacist  [place]  Direct Dial: [phone]

## 2023-09-11 MED ORDER — WEGOVY 0.25 MG/0.5ML ~~LOC~~ SOAJ: 0.2500 mg | SUBCUTANEOUS | 2 refills | Status: DC

## 2023-09-11 NOTE — Telephone Encounter (Signed)
 Patient is aware

## 2023-09-11 NOTE — Telephone Encounter (Signed)
 I switched him to [facility], thanks

## 2023-09-11 NOTE — Addendum Note (Signed)
 Addended by: Corita Diego A on: 09/11/2023 10:32 AM   Modules accepted: Orders

## 2023-10-23 ENCOUNTER — Other Ambulatory Visit (HOSPITAL_COMMUNITY): Payer: Self-pay

## 2023-10-23 ENCOUNTER — Encounter: Payer: Self-pay | Admitting: Family Medicine

## 2023-10-23 ENCOUNTER — Ambulatory Visit (INDEPENDENT_AMBULATORY_CARE_PROVIDER_SITE_OTHER): Admitting: Family Medicine

## 2023-10-23 ENCOUNTER — Telehealth: Payer: Self-pay

## 2023-10-23 VITALS — BP 130/80 | HR 93 | Temp 98.1°F | Wt 313.0 lb

## 2023-10-23 DIAGNOSIS — A549 Gonococcal infection, unspecified: Secondary | ICD-10-CM

## 2023-10-23 DIAGNOSIS — Z209 Contact with and (suspected) exposure to unspecified communicable disease: Secondary | ICD-10-CM

## 2023-10-23 NOTE — Telephone Encounter (Signed)
 Pharmacy Patient Advocate Encounter   Received notification from Physician's Office that prior authorization for Wegovy  0.25 is required/requested.   Insurance verification completed.   The patient is insured through [facility] .   Per test claim: PA required; PA submitted to above mentioned insurance via CoverMyMeds Key/confirmation #/EOC [id number] Status is pending

## 2023-10-23 NOTE — Progress Notes (Signed)
   Subjective:    Patient ID: Caleb Kent, male    DOB: 06-14-1995, 28 y.o.   MRN: [id number]  HPI Here asking for STD testing. He denies any symptoms, but he wants to make sure he is okay    Review of Systems  Constitutional: Negative.   Respiratory: Negative.    Cardiovascular: Negative.   Genitourinary: Negative.        Objective:   Physical Exam Constitutional:      Appearance: He is obese.  Cardiovascular:     Rate and Rhythm: Normal rate and regular rhythm.     Pulses: Normal pulses.     Heart sounds: Normal heart sounds.  Pulmonary:     Effort: Pulmonary effort is normal.     Breath sounds: Normal breath sounds.  Neurological:     Mental Status: He is alert.           Assessment & Plan:  STD testing, we will send him to the lab. Garnette Olmsted, MD

## 2023-10-23 NOTE — Telephone Encounter (Signed)
 Noted

## 2023-10-24 LAB — RPR: RPR Ser Ql: NONREACTIVE

## 2023-10-24 LAB — C. TRACHOMATIS/N. GONORRHOEAE RNA
C. trachomatis RNA, TMA: NOT DETECTED
N. gonorrhoeae RNA, TMA: DETECTED — AB

## 2023-10-24 LAB — HSV(HERPES SIMPLEX VRS) I + II AB-IGG
HSV 1 IGG,TYPE SPECIFIC AB: <0.9 {index}
HSV 2 IGG,TYPE SPECIFIC AB: <0.9 {index}

## 2023-10-24 LAB — HIV ANTIBODY (ROUTINE TESTING W REFLEX): HIV 1&2 Ab, 4th Generation: NONREACTIVE

## 2023-10-25 ENCOUNTER — Telehealth: Payer: Self-pay | Admitting: *Deleted

## 2023-10-25 NOTE — Telephone Encounter (Signed)
 Copied from CRM [id number]. Topic: Clinical - Lab/Test Results >> Oct 24, 2023  1:08 PM [place] wrote: Reason for CRM: Patient asking for a call when his test results come back- he does not have mychart

## 2023-10-25 NOTE — Telephone Encounter (Signed)
 Pharmacy Patient Advocate Encounter  Received notification from [facility] that Prior Authorization for Wegovy  0.25 has been DENIED.  Full denial letter will be uploaded to the media tab. See denial reason below.   PA #/Case ID/Reference #: [id number]

## 2023-10-26 NOTE — Telephone Encounter (Signed)
 Pt lab results have not been resulted by Dr Johnny, pt will be notified when Dr Johnny reviews lab results

## 2023-10-30 ENCOUNTER — Other Ambulatory Visit: Payer: Self-pay | Admitting: Family Medicine

## 2023-10-30 ENCOUNTER — Ambulatory Visit: Payer: Self-pay | Admitting: Family Medicine

## 2023-10-30 ENCOUNTER — Other Ambulatory Visit (HOSPITAL_COMMUNITY): Payer: Self-pay

## 2023-10-30 ENCOUNTER — Telehealth: Payer: Self-pay

## 2023-10-30 MED ORDER — CEFIXIME 200 MG PO CHEW: 400.0000 mg | CHEWABLE_TABLET | Freq: Once | ORAL | 0 refills | Status: DC

## 2023-10-30 MED ORDER — AZITHROMYCIN 500 MG PO TABS: 1000.0000 mg | ORAL_TABLET | Freq: Once | ORAL | 0 refills | Status: AC

## 2023-10-30 NOTE — Addendum Note (Signed)
 Addended by: JOHNNY SENIOR A on: 10/30/2023 12:54 PM   Modules accepted: Orders

## 2023-10-30 NOTE — Telephone Encounter (Signed)
 Please submitting PA for Zepbound for OSA

## 2023-10-30 NOTE — Telephone Encounter (Signed)
 Attempted to call pt for lab results but voicemail no set up

## 2023-10-30 NOTE — Telephone Encounter (Signed)
 Pharmacy Patient Advocate Encounter   Received notification from CoverMyMeds that prior authorization for Ozempic   is required/requested.   Insurance verification completed.   The patient is insured through [facility] .   Per test claim: Patient does not meet medication criteria for Ozempic , insurance denied Wegovy . We can try submitting PA for Zepbound for OSA if appropiate. Please advise.

## 2023-10-30 NOTE — Telephone Encounter (Signed)
 Done

## 2023-10-30 NOTE — Addendum Note (Signed)
 Addended by: JOHNNY SENIOR A on: 10/30/2023 12:57 PM   Modules accepted: Orders

## 2023-10-30 NOTE — Telephone Encounter (Signed)
 Pt is aware will get a call once lab results has been resulted by md

## 2023-10-31 ENCOUNTER — Telehealth: Payer: Self-pay

## 2023-10-31 ENCOUNTER — Other Ambulatory Visit (HOSPITAL_COMMUNITY): Payer: Self-pay

## 2023-10-31 NOTE — Telephone Encounter (Signed)
 Pharmacy Patient Advocate Encounter   Received notification from Pt Calls Messages that prior authorization for Zepbound 2.5 is required/requested.   Insurance verification completed.   The patient is insured through [facility] .   Per test claim: PA required; PA submitted to above mentioned insurance via CoverMyMeds Key/confirmation #/EOC [id number] Status is pending

## 2023-10-31 NOTE — Telephone Encounter (Signed)
 New KEY: [id number]

## 2023-11-01 NOTE — Progress Notes (Signed)
 Noted

## 2023-11-02 ENCOUNTER — Telehealth: Payer: Self-pay

## 2023-11-02 ENCOUNTER — Other Ambulatory Visit (HOSPITAL_COMMUNITY): Payer: Self-pay

## 2023-11-02 NOTE — Telephone Encounter (Signed)
  Copied from CRM [id number]. Topic: Clinical - Medication Question >> Nov 02, 2023 11:16 AM Thersia BROCKS wrote: Reason for CRM: Patient mom called in regarding the prescriptions Ozempic  and zepbound wouldnt cover prescription, called insurance , stated it covers like for diabetes , but insurance ask to send in a appeal for glp1 catergory to let them know that his sleep apnea effects his weight  Mom would like a callback regarding this  [phone]

## 2023-11-02 NOTE — Telephone Encounter (Signed)
 Pt notified of the denial of the GLP1

## 2023-11-02 NOTE — Telephone Encounter (Signed)
 Spoke with pt reviewed lab results,advised of Dr Johnny recommendation. Pt has a lab appointment for repeat of his STD screening on 11/13/23

## 2023-11-02 NOTE — Telephone Encounter (Signed)
 Pharmacy Patient Advocate Encounter  Received notification from [facility] that Prior Authorization for Zepbound 2.5 has been DENIED.  Full denial letter will be uploaded to the media tab. See denial reason below.     PA #/Case ID/Reference #: [id number]

## 2023-11-05 ENCOUNTER — Ambulatory Visit (HOSPITAL_BASED_OUTPATIENT_CLINIC_OR_DEPARTMENT_OTHER): Attending: Nurse Practitioner | Admitting: Internal Medicine

## 2023-11-05 VITALS — Ht 68.0 in | Wt 293.0 lb

## 2023-11-05 DIAGNOSIS — G4733 Obstructive sleep apnea (adult) (pediatric): Secondary | ICD-10-CM | POA: Insufficient documentation

## 2023-11-05 NOTE — Telephone Encounter (Signed)
 I guess he will have to pay himself

## 2023-11-05 NOTE — Telephone Encounter (Signed)
 Attempted to call pt no way to leave a message will try later

## 2023-11-06 ENCOUNTER — Telehealth: Payer: Self-pay | Admitting: Family Medicine

## 2023-11-06 NOTE — Telephone Encounter (Signed)
 Copied from CRM #[id number]. Topic: General - Other >> Nov 06, 2023 10:16 AM Jayma L wrote: Reason for CRM: patient mom called in , she's not on DRP but asked to have a nurse call her, patient was asleep so was unable to speak to patient >> Nov 06, 2023 10:58 AM Candice L wrote: We are unable to talk to the pt mom since she is not on the DPR. Can you all let agents know that if they are not on the DPR then we can't disclose any information concerning the pt.

## 2023-11-07 ENCOUNTER — Encounter: Payer: Self-pay | Admitting: Pulmonary Disease

## 2023-11-07 ENCOUNTER — Encounter: Admitting: Pulmonary Disease

## 2023-11-07 NOTE — Telephone Encounter (Signed)
 Spoke with pt advised that his insurance denies approving GLP1 for weight loss, pt advised the only option is to pay for the prescription out of pocket, voiced understanding

## 2023-11-08 NOTE — Telephone Encounter (Signed)
 Attempted to call pt mother no option to leave a message on mobile phone will try again later

## 2023-11-13 ENCOUNTER — Other Ambulatory Visit

## 2023-11-14 NOTE — Telephone Encounter (Signed)
 Please send a PA appeal for Ozempic  pt insurance states that it should be worded that pt needs it for his sleep apnea.

## 2023-11-14 NOTE — Telephone Encounter (Signed)
 Pt mother came by the office advised that pt insurance needs a PA appeal for Ozempic  should be worded that pt needs this for his sleep apnea. Sent to the PA team

## 2023-11-15 ENCOUNTER — Telehealth: Payer: Self-pay | Admitting: Pharmacist

## 2023-11-15 NOTE — Telephone Encounter (Signed)
 Ozempic  does not have an FDA-approved indication for the treatment of OSA and therefore, would not qualify for an appeal. The only GLP-1 currently approved for this indication is Zepbound. I see the patient has a denial for Zepbound on file and, based on the sleep study, does have severe OSA. We can certainly proceed with an appeal for Zepbound if that would be clinically appropriate. Please advise.  Thank you, Devere Pandy, PharmD Clinical Pharmacist  [place]  Direct Dial: [phone]

## 2023-11-15 NOTE — Telephone Encounter (Signed)
 Information has been sent to clinical pharmacist for appeals review. It may take 5-7 days to prepare the necessary documentation to request the appeal from the insurance.

## 2023-11-15 NOTE — Telephone Encounter (Signed)
 Yes please proceed with the appeal, pt just had a sleep study on 11/09/23 but the results are not back.

## 2023-11-15 NOTE — Telephone Encounter (Signed)
 Attempted to call [facility] for pt sleep study test, will try call back after lunch

## 2023-11-16 ENCOUNTER — Telehealth: Payer: Self-pay | Admitting: Pharmacist

## 2023-11-16 NOTE — Telephone Encounter (Signed)
 While we continue working to get the Zepbound appeal approved for OSA, the insurer is requesting additional documentation to show that the patient is making dietary and lifestyle modifications while on the medication. In the 08/29/23 OV, Dr. Johnny notes that diet and exercise were discussed, but the insurance company is asking for more specific details. For example, they would like to see the patient's planned daily caloric intake and the amount and type of exercise he intends to complete each week. Adding this information to the chart note should help strengthen our appeal.  Thank you, Devere Pandy, PharmD Clinical Pharmacist  [place]  Direct Dial: [phone]

## 2023-11-16 NOTE — Telephone Encounter (Signed)
 Can the sleep study report from 04/2023 be used since it was done this year?

## 2023-11-16 NOTE — Telephone Encounter (Signed)
 Again- Ozempic  is not FDA approved for sleep apnea and therefore does not qualify for an appeal.  Would you like me to appeal the Zepbound for OSA?

## 2023-11-16 NOTE — Telephone Encounter (Signed)
 Yes please appeal for Zepbound

## 2023-11-16 NOTE — Telephone Encounter (Signed)
 Please let me know when notes have been updated and I can get the appeal submitted!  Thank you!

## 2023-11-16 NOTE — Telephone Encounter (Signed)
No problem thank you

## 2023-11-16 NOTE — Telephone Encounter (Signed)
 Noted

## 2023-11-18 NOTE — Procedures (Signed)
  Indications for Polysomnography The patient is a 28 year old Male who is 5' 8 and weighs 293.0 lbs. His BMI equals 44.9.  A full night titration treatment study was performed.  No medications were reported taken during the night.No Data. Polysomnogram Data A full night polysomnogram recorded the standard physiologic parameters including EEG, EOG, EMG, EKG, nasal and oral airflow.  Respiratory parameters of chest and abdominal movements were recorded with Respiratory Inductance Plethysmography belts.   Oxygen saturation was recorded by pulse oximetry.  Sleep Architecture The total recording time of the polysomnogram was 396.4 minutes.  The total sleep time was 376.5 minutes.  The patient spent 3.5% of total sleep time in Stage N1, 64.5% in Stage N2, 3.5% in Stages N3, and 28.6% in REM.  Sleep latency was 2.1 minutes.   REM latency was 61.0 minutes.  Sleep Efficiency was 95.0%.  Wake after Sleep Onset time was 17.5 minutes.  Titration Summary The patient was titrated at pressures ranging from 10/6/0* cm/H20 with supplemental oxygen at - up to 27/23/0* cm/H20 with supplemental oxygen at O2: 2.  The last pressure used in the study was 27/23/0* cm/H20 with supplemental oxygen at O2: 2.  Respiratory Events The polysomnogram revealed a presence of 26 obstructive, 1 central, and - mixed apneas resulting in an Apnea index of 4.3 events per hour.  There were 138 hypopneas (GreaterEqual to3% desaturation and/or arousal) resulting in an Apnea\Hypopnea Index (AHI  GreaterEqual to3% desaturation and/or arousal) of 26.3 events per hour.  There were 98 hypopneas (GreaterEqual to4% desaturation) resulting in an Apnea\Hypopnea Index (AHI GreaterEqual to4% desaturation) of 19.9 events per hour.  There were 16  Respiratory Effort Related Arousals resulting in a RERA index of 2.5 events per hour. The Respiratory Disturbance Index is 28.8 events per hour.  The snore index was - events per hour.  Mean oxygen  saturation was 87.9%.  The lowest oxygen saturation during sleep was 68.0%.  Time spent LessEqual to88% oxygen saturation was  minutes ().  Limb Activity There were - limb movements recorded.  Of this total, - were classified as PLMs.  Of the PLMs, - were associated with arousals.  The Limb Movement index was - per hour while the PLM index was - per hour.  Cardiac Summary The average pulse rate was 88.6 bpm.  The minimum pulse rate was 53.0 bpm while the maximum pulse rate was 112.0 bpm.  Cardiac rhythm was normal/abnormal.  Comments:  Diagnosis:  Recommendations:   This study was personally reviewed and electronically signed by: Dr. Reggy Salt Accredited Board Certified in Sleep Medicine Date/Time:

## 2023-11-18 NOTE — Procedures (Signed)
 [facility] [address] [place], [place] [address] Tel: [phone]   Fax: [phone]  Titration Interpretation  Patient Name:  Caleb Kent, Caleb Kent Date:  11/05/2023 Referring Physician:  KATHERINE COBB [phone]) %%startinterp%% Indications for Polysomnography The patient is a 29 year old Male who is 5' 8 and weighs 293.0 lbs. His BMI equals 44.9.  A full night titration treatment study was performed. Baseline Split PSG 04/27/23- AHI 136.5/hr, desaturation to 51%, body weight 305 lbs  No medications were reported taken during the night.  No Data.   Polysomnogram Data A full night polysomnogram recorded the standard physiologic parameters including EEG, EOG, EMG, EKG, nasal and oral airflow.  Respiratory parameters of chest and abdominal movements were recorded with Respiratory Inductance Plethysmography belts.  Oxygen saturation was recorded by pulse oximetry.   Sleep Architecture The total recording time of the polysomnogram was 396.4 minutes.  The total sleep time was 376.5 minutes.  The patient spent 3.5% of total sleep time in Stage N1, 64.5% in Stage N2, 3.5% in Stages N3, and 28.6% in REM.  Sleep latency was 2.1 minutes.  REM latency was 61.0 minutes.  Sleep Efficiency was 95.0%.  Wake after Sleep Onset time was 17.5 minutes.  Titration Summary The patient was titrated at pressures ranging from 10/6/0* cm/H20 with supplemental oxygen at - up to 27/23/0* cm/H20 with supplemental oxygen at O2: 2.  The last pressure used in the study was 27/23/0* cm/H20 with supplemental oxygen at O2: 2.  Respiratory Events The polysomnogram revealed a presence of 26 obstructive, 1 central, and - mixed apneas resulting in an Apnea index of 4.3 events per hour.  There were 138 hypopneas (>=3% desaturation and/or arousal) resulting in an Apnea\Hypopnea Index (AHI >=3% desaturation and/or arousal) of 26.3 events per hour.  There were 98 hypopneas (>=4%  desaturation) resulting in an Apnea\Hypopnea Index (AHI >=4% desaturation) of 19.9 events per hour.  There were 16 Respiratory Effort Related Arousals resulting in a RERA index of 2.5 events per hour. The Respiratory Disturbance Index is 28.8 events per hour.  The snore index was - events per hour.  Mean oxygen saturation was 87.9%.  The lowest oxygen saturation during sleep was 68.0%.  Time spent <=88% oxygen saturation was 172.5 minutes (43.7%).  Limb Activity There were - limb movements recorded.  Of this total, - were classified as PLMs.  Of the PLMs, - were associated with arousals.  The Limb Movement index was - per hour while the PLM index was - per hour.  Cardiac Summary The average pulse rate was 88.6 bpm.  The minimum pulse rate was 53.0 bpm while the maximum pulse rate was 112.0 bpm.  Cardiac rhythm was norma- intervals of sinus tachycardia.  Comments:  BIPAP titrated to 27/23. PS 0 with residual AHI (4%) 0/h.  Supplemental O2 was added at 2L/min per protocol for persistent saturations </= 88%. Final minimum O2 saturation 91%, mean 92.2%.  Diagnosis: Obstructive sleep apnea  Recommendations: Maximum available BIPAP pressure is 25 cwp. Suggest trial of BIPAP 25/23, PS 0 with 3L supplemental O2. Alternatively consider autoBIPAP PS 5, IPAP max 25, EPAP min 15. Emphasize weight loss. Consider ENT evaluation for correctable upper airway obstruction such as large tonsils. Patient wore a medium F&P Simplus full face mask with heated humidity.   This study was personally reviewed and electronically signed by: Dr. Reggy Salt Accredited Board Certified in Sleep Medicine Date/Time: 11/18/23 11:24    %%endinterp%%  Titration Report  Patient Name: Caleb Kent, Caleb Kent Date: 11/05/2023  Date of Birth: 1995/07/08 Study Type: Bilevel Titration  Age: 30 year MRN #: [id number]  Sex: Male Interpreting Physician: NEYSA RAMA, [id number]  Height: 5' 8 Referring Physician: KATHERINE COBB  [phone])  Weight: 293.0 lbs Recording Tech: Orie Sires RRT RPSGT RST  BMI: 44.9 Scoring Tech: Orie Sires RRT RPSGT RST  ESS: 21 Neck Size: 17.5  Mask Type [employer] Simplus Final Pressure: 27/23 CMH2O  Mask Size: Medium Supplemental O2: 2 LPM   Study Overview  Lights Off: 10:17:56 PM  Count Index  Lights On: 04:54:16 AM Awakenings: 15 2.4  Time in Bed: 396.4 min. Arousals: 90 14.3  Total Sleep Time: 376.5 min. AHI (>=3% Desat and/or Ar.): 165 26.3   Sleep Efficiency: 95.0% AHI (>=4% Desat): 125 19.9   Sleep Latency: 2.1 min. Limb Movements: - -  Wake After Sleep Onset: 17.5 min. Snore: - -  REM Latency from Sleep Onset: 61.0 min. Desaturations: 188 30.0     Minimum SpO2 TST: 68.0%    Sleep Architecture  % of Time in Bed Stages Time (mins) % Sleep Time  Wake 20.5   Stage N1 13.0 3.5%  Stage N2 243.0 64.5%  Stage N3 13.0 3.5%  REM 107.5 28.6%   Arousal Summary   NREM REM Sleep Index  Respiratory Arousals 64 5 69 11.0  PLM Arousals - - - -  Isolated Limb Movement Arousals - - - -  Snore Arousals - - - -  Spontaneous Arousals 19 2 21  3.3  Total 83 7 90 14.3   Limb Movement Summary   Count Index  Isolated Limb Movements - -  Periodic Limb Movements (PLMs) - -  Total Limb Movements - -    Respiratory Summary   By Sleep Stage By Body Position Total   NREM REM Supine Non-Supine   Time (min) 269.0 107.5 207.5 169.0 376.5         Obstructive Apnea 26 - 20 6 26   Mixed Apnea - - - - -  Central Apnea - 1 1 - 1  Total Apneas 26 1 21 6 27   Total Apnea Index 5.8 0.6 6.1 2.1 4.3         Hypopneas (>=3% Desat and/or Ar.) 114 24 61 77 138  AHI (>=3% Desat and/or Ar.) 31.2 14.0 23.7 29.5 26.3         Hypopneas (>=4% Desat) 91 7 34 64 98  AHI (>=4% Desat) 26.1 4.5 15.9 24.9 19.9          RERAs 11 5 14 2 16   RERA Index 2.5 2.8 4.0 0.7 2.5         RDI 33.7 16.7 27.8 30.2 28.8     Respiratory Event Durations   Apnea Hypopnea   NREM REM NREM REM  Average  (seconds) 14.7 10.3 14.9 19.9  Maximum (seconds) 20.0 10.3 41.4 44.6    Oxygen Saturation Summary   Wake NREM REM TST TIB  Average SpO2 88.2% 87.7% 88.5% 87.9% 87.9%  Minimum SpO2 70.0% 68.0% 72.0% 68.0% 68.0%  Maximum SpO2 98.0% 97.0% 97.0% 97.0% 98.0%   Oxygen Saturation Distribution  Range (%) Time in range (min) Time in range (%)  90.0 - 100.0 69.6 17.6%  80.0 - 90.0 296.7 75.1%  70.0 - 80.0 28.2 7.1%  60.0 - 70.0 0.5 0.1%  50.0 - 60.0 - -  0.0 - 50.0 - -  Time Spent <=88% SpO2  Range (%) Time in range (min)  Time in range (%)  0.0 - 88.0 172.5 43.7%      Count Index  Desaturations 188 30.0    Cardiac Summary   Wake NREM REM Sleep Total  Average Pulse Rate (BPM) 90.2 88.2 89.2 88.5 88.6  Minimum Pulse Rate (BPM) 69.0 62.0 53.0 53.0 53.0  Maximum Pulse Rate (BPM) 112.0 111.0 112.0 112.0 112.0   Pulse Rate Distribution:  Range (bpm) Time in range (min) Time in range (%)  0.0 - 40.0 - -  40.0 - 60.0 0.5 0.1%  60.0 - 80.0 108.8 27.5%  80.0 - 100.0 215.4 54.5%  100.0 - 120.0 68.6 17.3%  120.0 - 140.0 - -  140.0 - 200.0 - -   Titration Summary  PAP Device PAP Level O2 Level Time (min) Wake (min) NREM (min) REM (min) Sleep Eff% OA# CA# MA# Hyp# (>=3%) AHI (>=3%) Hyp# (>=4%) AHI (>=%4) RERA RDI SpO2 <=88% (min) Min SpO2 Mean SpO2 Ar. Index  BiLevel 10/6/0 - 21.5 7.0 14.5 0.0 67.4% 18 - - 22 165.5 20  157.2 1  169.7  11.7 70.0 82.0 136.6  BiLevel 12/8/0 - 15.0 3.5 11.5 0.0 76.7% 5 - - 25 156.5 23  146.1 -  156.5  10.2 68.0 82.0 62.6  BiLevel 14/10/0 - 19.0 2.5 16.5 0.0 86.8% - - - 29 105.5 26  94.5 1  109.1  14.4 70.0 82.2 40.0  BiLevel 16/12/0 - 22.0 0.0 10.0 12.0 100.0% - - - 8 21.8 4  10.9 -  21.8  22.0 72.0 80.6 5.5  BiLevel 18/14/0 - 29.0 0.0 10.5 18.5 100.0% - - - 4 8.3 1  2.1 -  8.3  28.6 76.0 85.8 -  BiLevel 19/15/0 - 12.5 0.0 12.5 0.0 100.0% - - - - - -  - -  -  12.5 86.0 86.9 -  BiLevel 19/15/0 O2: 1 36.5 1.5 35.0 0.0 95.9% 1 - - 14 25.7 11  20.6 -  25.7   3.0 82.0 89.5 17.1  BiLevel 20/16/0 O2: 1 63.5 3.0 54.0 6.5 95.3% - - - 16 15.9 9  8.9 3  18.8  22.4 84.0 88.7 5.0  BiLevel 22/18/0 O2: 1 55.5 0.0 7.0 48.5 100.0% - 1 - 8 9.7 2  3.2 -  9.7  15.4 83.0 89.2 2.2  BiLevel 24/20/0 O2: 1 15.0 0.0 15.0 0.0 100.0% - - - - - -  - -  -  14.8 86.0 87.5 -  BiLevel 24/20/0 O2: 2 47.5 0.0 47.5 0.0 100.0% 2 - - 3 6.3 1  3.8 -  6.3  10.2 84.0 89.1 5.1  BiLevel 25/21/0 O2: 2 22.5 0.5 16.0 6.0 97.8% - - - 4 10.9 -  - 4  21.8  0.0 89.0 91.1 8.2  BiLevel 26/22/0 O2: 2 22.5 0.0 6.5 16.0 100.0% - - - 5 13.3 1  2.7 4  24.0  0.0 91.0 93.8 10.7  BiLevel 27/23/0 O2: 2 15.0 2.5 12.5 0.0 83.3% - - - - - -  - 3  14.4  0.0 91.0 92.2 19.2    Hypnograms                           Technologist Comments  Patient was ordered as a Bilevel titration. Patient is a 28 year old African American male who was sent to the sleep center for severe OSA. Patient was placed on Bilevel of 10/6 CMH2O at 10:17 pm and was increased to a Bilevel pressure of 27/23  CMH2O with no audible snoring noted. Patient was increased for respiratory events with and without a 4% Desat or arousal, audible snoring and request for more air. Patient tolerated Bilevel trial fairly well. Patient was placed on 1 LPM of oxygen at 12:16 am and was increased to 2 LPM of oxygen at 3:07 am for low Sao2 levels 88% and below per lab protocol. No medications were reported taken. Study was performed in room # 4. No PLM's/PLMA's were noted. Questionable cardiac arrhythmia was noted, also tachycardia was noted during the night see attached sheets and epochs for examples: 116, 131, 143, 249, etc. No restroom visits were noted. Patient was fitted with a Fisher [employer] Pay[employer] Simplus nasal/oral full-face mask size medium with heated humidification                            Reggy Salt Diplomate, Biomedical engineer of Sleep Medicine  ELECTRONICALLY SIGNED ON:  11/18/2023, 11:06 AM [facility] PH: [phone]   FX: [phone] ACCREDITED BY THE AMERICAN ACADEMY OF SLEEP MEDICINE

## 2023-11-19 NOTE — Telephone Encounter (Signed)
 The sleep study results ar on Epic for dates 11/09/23 and 11/18/23

## 2023-11-21 ENCOUNTER — Telehealth: Payer: Self-pay | Admitting: Pharmacist

## 2023-11-21 NOTE — Telephone Encounter (Signed)
 Dr Johnny has amended the note and has added the exercise routine and daily calories consumed. Please appeal the PA. Thank you

## 2023-11-21 NOTE — Telephone Encounter (Signed)
 See my note from 08-29-23. I have amended this to include the calories he eats per day and the exercise routine.

## 2023-11-21 NOTE — Telephone Encounter (Signed)
**Note De-identified  [place] Obfuscation** Please advise 

## 2023-11-21 NOTE — Telephone Encounter (Signed)
 Appeal has been submitted for Zepbound. Will advise when response is received or follow up in 1 week. Please be advised that most companies may take 30 days to make a decision. Appeal letter and supporting documentation have been faxed to [phone] on 11/21/2023 @4 :33 pm.  Thank you, Devere Pandy, PharmD Clinical Pharmacist  [place]  Direct Dial: [phone]

## 2023-11-22 NOTE — Telephone Encounter (Signed)
 Pt notified of the update of the PA appeal via MyChart

## 2023-11-28 ENCOUNTER — Other Ambulatory Visit (HOSPITAL_COMMUNITY): Payer: Self-pay

## 2023-11-28 ENCOUNTER — Telehealth: Payer: Self-pay | Admitting: Nurse Practitioner

## 2023-11-28 DIAGNOSIS — G4733 Obstructive sleep apnea (adult) (pediatric): Secondary | ICD-10-CM

## 2023-11-28 NOTE — Telephone Encounter (Signed)
 I called and spoke with the pt and notified of results/recs  I placed urgent BIPAP order and alerted PCC's this needs to be taken care of asap Pt states will call back to schedule compliance f/u once he gets machine  Nothing further needed

## 2023-11-28 NOTE — Telephone Encounter (Signed)
 11/05/2023 BiPAP titration: Difficulties with control despite BiPAP therapy. Recommend autoBIPAP PS 5, IPAP max 25, EPAP min 15 with 3 lpm supplemental O2 bled in. Needs OV in 6-8 weeks. Please place urgent. Thanks.

## 2023-12-11 ENCOUNTER — Other Ambulatory Visit (HOSPITAL_COMMUNITY): Payer: Self-pay

## 2023-12-11 NOTE — Telephone Encounter (Signed)
 The appeal for Zepbound  has been approved by the insurance company.  It is effective through 06/08/2024.      Thank you, Devere Pandy, PharmD Clinical Pharmacist  [place]  Direct Dial: [phone]

## 2023-12-12 ENCOUNTER — Other Ambulatory Visit (HOSPITAL_COMMUNITY): Payer: Self-pay

## 2023-12-12 ENCOUNTER — Telehealth: Payer: Self-pay

## 2023-12-12 MED ORDER — TIRZEPATIDE-WEIGHT MANAGEMENT 2.5 MG/0.5ML ~~LOC~~ SOLN: 2.5000 mg | SUBCUTANEOUS | 5 refills | Status: AC

## 2023-12-12 NOTE — Addendum Note (Signed)
 Addended by: JOHNNY SENIOR A on: 12/12/2023 05:35 PM   Modules accepted: Orders

## 2023-12-12 NOTE — Telephone Encounter (Signed)
 Done

## 2023-12-12 NOTE — Telephone Encounter (Signed)
 Pharmacy Patient Advocate Encounter  Received notification from [facility] [employer] that Prior Authorization for Zepbound  2.5 has been APPROVED from 12/10/23 to 06/08/24. Ran test claim, Copay is $24.99. This test claim was processed through [facility]- copay amounts may vary at other pharmacies due to pharmacy/plan contracts, or as the patient moves through the different stages of their insurance plan.

## 2023-12-13 ENCOUNTER — Telehealth: Payer: Self-pay | Admitting: *Deleted

## 2023-12-13 NOTE — Telephone Encounter (Signed)
 Pt mother notified that Rx was sent to the pharmacy, voiced understanding

## 2023-12-13 NOTE — Telephone Encounter (Signed)
 Already spoke with pt regarding this message

## 2023-12-13 NOTE — Telephone Encounter (Signed)
 Copied from CRM [id number]. Topic: General - Call Back - No Documentation >> Dec 12, 2023  5:09 PM Chiquita SQUIBB wrote: Reason for CRM: Patients mother is calling in due to a missed call, please contact the patient again tomorrow 09/10

## 2023-12-28 ENCOUNTER — Other Ambulatory Visit: Payer: Self-pay

## 2023-12-28 ENCOUNTER — Ambulatory Visit: Admitting: Family Medicine

## 2023-12-28 ENCOUNTER — Encounter: Payer: Self-pay | Admitting: Family Medicine

## 2023-12-28 VITALS — BP 124/82 | HR 92 | Temp 98.8°F | Wt 316.0 lb

## 2023-12-28 DIAGNOSIS — L02229 Furuncle of trunk, unspecified: Secondary | ICD-10-CM

## 2023-12-28 MED ORDER — ALBUTEROL SULFATE (2.5 MG/3ML) 0.083% IN NEBU: INHALATION_SOLUTION | RESPIRATORY_TRACT | 11 refills | Status: AC

## 2023-12-28 MED ORDER — DOXYCYCLINE HYCLATE 100 MG PO TABS: 100.0000 mg | ORAL_TABLET | Freq: Two times a day (BID) | ORAL | 0 refills | Status: AC

## 2023-12-28 NOTE — Progress Notes (Signed)
   Subjective:    Patient ID: Caleb Kent, male    DOB: 1995/09/03, 27 y.o.   MRN: [id number]  HPI Here for a boil in the right lower abdomen that came up one week ago. It was fairly tender at first. Then it opened and drained yesterday, so now it feels better.   Review of Systems  Constitutional: Negative.   Respiratory: Negative.    Cardiovascular: Negative.        Objective:   Physical Exam Constitutional:      Appearance: Normal appearance.  Cardiovascular:     Rate and Rhythm: Normal rate and regular rhythm.     Pulses: Normal pulses.     Heart sounds: Normal heart sounds.  Pulmonary:     Effort: Pulmonary effort is normal.     Breath sounds: Normal breath sounds.  Skin:    Comments: There is a small non-tender boil on the right lower abdomen that has opened and is now flat   Neurological:     Mental Status: He is alert.           Assessment & Plan:  Boil, we will treat with 7 days of Doxycycline .  Garnette Olmsted, MD
